# Patient Record
Sex: Male | Born: 1980 | Race: Black or African American | Hispanic: No | Marital: Single | Smoking: Never smoker
Health system: Southern US, Community
[De-identification: ages and names within clinical notes are randomized; demographics above are authoritative.]

## PROBLEM LIST (undated history)

## (undated) DIAGNOSIS — M1990 Unspecified osteoarthritis, unspecified site: Secondary | ICD-10-CM

## (undated) HISTORY — PX: HAND SURGERY: SHX662

---
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
## 2003-03-21 ENCOUNTER — Emergency Department (HOSPITAL_COMMUNITY): Admission: EM | Admit: 2003-03-21 | Discharge: 2003-03-21 | Payer: Self-pay | Admitting: Internal Medicine

## 2005-02-25 ENCOUNTER — Emergency Department (HOSPITAL_COMMUNITY): Admission: EM | Admit: 2005-02-25 | Discharge: 2005-02-25 | Payer: Self-pay | Admitting: Emergency Medicine

## 2005-04-19 ENCOUNTER — Emergency Department (HOSPITAL_COMMUNITY): Admission: EM | Admit: 2005-04-19 | Discharge: 2005-04-19 | Payer: Self-pay | Admitting: Emergency Medicine

## 2005-05-07 ENCOUNTER — Ambulatory Visit (HOSPITAL_COMMUNITY): Admission: RE | Admit: 2005-05-07 | Discharge: 2005-05-07 | Payer: Self-pay | Admitting: *Deleted

## 2008-02-03 ENCOUNTER — Emergency Department (HOSPITAL_COMMUNITY): Admission: EM | Admit: 2008-02-03 | Discharge: 2008-02-03 | Payer: Self-pay | Admitting: Emergency Medicine

## 2008-06-14 ENCOUNTER — Emergency Department (HOSPITAL_COMMUNITY): Admission: EM | Admit: 2008-06-14 | Discharge: 2008-06-14 | Payer: Self-pay | Admitting: Emergency Medicine

## 2008-07-11 ENCOUNTER — Emergency Department (HOSPITAL_COMMUNITY): Admission: EM | Admit: 2008-07-11 | Discharge: 2008-07-11 | Payer: Self-pay | Admitting: Emergency Medicine

## 2008-07-18 ENCOUNTER — Encounter: Payer: Self-pay | Admitting: Orthopedic Surgery

## 2008-07-19 ENCOUNTER — Ambulatory Visit: Payer: Self-pay | Admitting: Orthopedic Surgery

## 2008-07-19 DIAGNOSIS — S62339A Displaced fracture of neck of unspecified metacarpal bone, initial encounter for closed fracture: Secondary | ICD-10-CM | POA: Insufficient documentation

## 2008-07-24 ENCOUNTER — Encounter: Payer: Self-pay | Admitting: Orthopedic Surgery

## 2008-10-16 ENCOUNTER — Emergency Department (HOSPITAL_COMMUNITY): Admission: EM | Admit: 2008-10-16 | Discharge: 2008-10-16 | Payer: Self-pay | Admitting: Emergency Medicine

## 2010-08-04 LAB — POCT I-STAT, CHEM 8
BUN: 9 mg/dL (ref 6–23)
Calcium, Ion: 1.07 mmol/L — ABNORMAL LOW (ref 1.12–1.32)
Chloride: 105 mEq/L (ref 96–112)
Creatinine, Ser: 1.4 mg/dL (ref 0.4–1.5)
Glucose, Bld: 94 mg/dL (ref 70–99)
HCT: 42 % (ref 39.0–52.0)
Hemoglobin: 14.3 g/dL (ref 13.0–17.0)
Potassium: 3.8 mEq/L (ref 3.5–5.1)
Sodium: 139 mEq/L (ref 135–145)
TCO2: 23 mmol/L (ref 0–100)

## 2010-08-12 LAB — COMPREHENSIVE METABOLIC PANEL
ALT: 65 U/L — ABNORMAL HIGH (ref 0–53)
AST: 37 U/L (ref 0–37)
Albumin: 3.8 g/dL (ref 3.5–5.2)
Alkaline Phosphatase: 70 U/L (ref 39–117)
BUN: 14 mg/dL (ref 6–23)
CO2: 28 mEq/L (ref 19–32)
Calcium: 9.2 mg/dL (ref 8.4–10.5)
Chloride: 103 mEq/L (ref 96–112)
Creatinine, Ser: 1.06 mg/dL (ref 0.4–1.5)
GFR calc Af Amer: >60 mL/min (ref >60)
GFR calc non Af Amer: >60 mL/min (ref >60)
Glucose, Bld: 114 mg/dL — ABNORMAL HIGH (ref 70–99)
Potassium: 4 mEq/L (ref 3.5–5.1)
Sodium: 140 mEq/L (ref 135–145)
Total Bilirubin: 0.7 mg/dL (ref 0.3–1.2)
Total Protein: 8.1 g/dL (ref 6.0–8.3)

## 2010-08-12 LAB — DIFFERENTIAL
Basophils Absolute: 0 10*3/uL (ref 0.0–0.1)
Basophils Relative: 0 % (ref 0–1)
Eosinophils Absolute: 0.2 10*3/uL (ref 0.0–0.7)
Eosinophils Relative: 2 % (ref 0–5)
Lymphocytes Relative: 5 % — ABNORMAL LOW (ref 12–46)
Lymphs Abs: 0.6 10*3/uL — ABNORMAL LOW (ref 0.7–4.0)
Monocytes Absolute: 0.9 10*3/uL (ref 0.1–1.0)
Monocytes Relative: 7 % (ref 3–12)
Neutro Abs: 10.9 10*3/uL — ABNORMAL HIGH (ref 1.7–7.7)
Neutrophils Relative %: 86 % — ABNORMAL HIGH (ref 43–77)

## 2010-08-12 LAB — CBC
HCT: 43.1 % (ref 39.0–52.0)
Hemoglobin: 14 g/dL (ref 13.0–17.0)
MCHC: 32.4 g/dL (ref 30.0–36.0)
MCV: 75.7 fL — ABNORMAL LOW (ref 78.0–100.0)
Platelets: 274 10*3/uL (ref 150–400)
RBC: 5.69 MIL/uL (ref 4.22–5.81)
RDW: 16.2 % — ABNORMAL HIGH (ref 11.5–15.5)
WBC: 12.6 10*3/uL — ABNORMAL HIGH (ref 4.0–10.5)

## 2010-09-12 NOTE — Op Note (Signed)
NAME:  Adam Cowan, Adam Cowan                ACCOUNT NO.:  [id number]   MEDICAL RECORD NO.:  [id number]           PATIENT TYPE:   LOCATION:                                 FACILITY:   PHYSICIAN:  Tennis Must. Meyerdierks, M.D.DATE OF BIRTH:   DATE OF PROCEDURE:  05/07/2005  DATE OF DISCHARGE:                                 OPERATIVE REPORT   PREOP DIAGNOSIS:  Carpometacarpal dislocation, right fourth and fifth rays.   POSTOP DIAGNOSIS:  Carpometacarpal dislocation, right fourth and fifth rays.   PROCEDURE:  Closed reduction and percutaneous pinning carpometacarpal (CMC)  dislocation, right fourth and fifth ray.   SURGEON:  Lowell Bouton, M.D.   ANESTHESIA:  General.   OPERATIVE FINDINGS:  The patient had unstable dislocation dorsally of the  fourth and fifth carpometacarpal joints.   DESCRIPTION OF PROCEDURE:  Under general anesthesia the right hand was  prepped and draped in the usual fashion; and a closed reduction was  performed at the base of the fourth and fifth metacarpals. X-ray showed good  position of the joints. A 45 K-wire was passed longitudinally through the  fourth metacarpal into the carpus holding the joint reduced. A second 45 K-  wire was then placed across the fifth metacarpal into the hamate, holding  the joint reduced. The pins were bent over and left protruding from the  skin. X-rays showed good position of the joint. The patient was placed in a  volar wrist splint. He tolerated the procedure well, and went to the  recovery room awake and stable in good condition.      Lowell Bouton, M.D.  Electronically Signed     EMM/MEDQ  D:  05/07/2005  T:  05/07/2005  Job:  [id number]   cc:   Teola Bradley, M.D.  Fax: [phone]

## 2010-10-07 ENCOUNTER — Emergency Department (HOSPITAL_COMMUNITY)
Admission: EM | Admit: 2010-10-07 | Discharge: 2010-10-07 | Disposition: A | Discharge status: Home / Self Care | Payer: Self-pay | Attending: Emergency Medicine | Admitting: Emergency Medicine

## 2010-10-07 DIAGNOSIS — L02619 Cutaneous abscess of unspecified foot: Secondary | ICD-10-CM | POA: Insufficient documentation

## 2010-10-07 DIAGNOSIS — M7989 Other specified soft tissue disorders: Secondary | ICD-10-CM | POA: Insufficient documentation

## 2011-01-26 LAB — POCT I-STAT, CHEM 8
BUN: 10
Calcium, Ion: 1.13
Chloride: 106
Creatinine, Ser: 1.2
Glucose, Bld: 96
HCT: 43
Hemoglobin: 14.6
Potassium: 3.9
Sodium: 146 — ABNORMAL HIGH
TCO2: 26

## 2011-01-26 LAB — CBC
HCT: 39.3
Hemoglobin: 12.7 — ABNORMAL LOW
MCHC: 32.3
MCV: 77.5 — ABNORMAL LOW
Platelets: 264
RBC: 5.07
RDW: 16.4 — ABNORMAL HIGH
WBC: 8.6

## 2011-01-26 LAB — DIFFERENTIAL
Basophils Absolute: 0
Basophils Relative: 0
Eosinophils Absolute: 0.3
Eosinophils Relative: 3
Lymphocytes Relative: 35
Lymphs Abs: 3
Monocytes Absolute: 0.7
Monocytes Relative: 9
Neutro Abs: 4.5
Neutrophils Relative %: 53

## 2011-01-26 LAB — RAPID URINE DRUG SCREEN, HOSP PERFORMED
Amphetamines: NOT DETECTED
Barbiturates: NOT DETECTED
Benzodiazepines: NOT DETECTED
Cocaine: NOT DETECTED
Opiates: POSITIVE — AB
Tetrahydrocannabinol: NOT DETECTED

## 2011-01-26 LAB — POCT CARDIAC MARKERS
CKMB, poc: 1.6
Myoglobin, poc: 66
Troponin i, poc: <0.05

## 2011-01-26 LAB — D-DIMER, QUANTITATIVE: D-Dimer, Quant: 0.32

## 2011-11-24 ENCOUNTER — Emergency Department (HOSPITAL_COMMUNITY): Payer: Self-pay

## 2011-11-24 ENCOUNTER — Emergency Department (HOSPITAL_COMMUNITY)
Admission: EM | Admit: 2011-11-24 | Discharge: 2011-11-25 | Disposition: A | Discharge status: Home / Self Care | Payer: Self-pay | Attending: Emergency Medicine | Admitting: Emergency Medicine

## 2011-11-24 ENCOUNTER — Encounter (HOSPITAL_COMMUNITY): Payer: Self-pay | Admitting: Emergency Medicine

## 2011-11-24 DIAGNOSIS — S6010XA Contusion of unspecified finger with damage to nail, initial encounter: Secondary | ICD-10-CM

## 2011-11-24 DIAGNOSIS — Y9269 Other specified industrial and construction area as the place of occurrence of the external cause: Secondary | ICD-10-CM | POA: Insufficient documentation

## 2011-11-24 DIAGNOSIS — Y990 Civilian activity done for income or pay: Secondary | ICD-10-CM | POA: Insufficient documentation

## 2011-11-24 DIAGNOSIS — R229 Localized swelling, mass and lump, unspecified: Secondary | ICD-10-CM | POA: Insufficient documentation

## 2011-11-24 DIAGNOSIS — S6990XA Unspecified injury of unspecified wrist, hand and finger(s), initial encounter: Secondary | ICD-10-CM | POA: Insufficient documentation

## 2011-11-24 DIAGNOSIS — IMO0002 Reserved for concepts with insufficient information to code with codable children: Secondary | ICD-10-CM | POA: Insufficient documentation

## 2011-11-24 MED ORDER — HYDROCODONE-ACETAMINOPHEN 5-325 MG PO TABS: 1.0000 | ORAL_TABLET | Freq: Four times a day (QID) | ORAL | Status: AC | PRN

## 2011-11-24 NOTE — ED Provider Notes (Signed)
History     CSN: [id number]  Arrival date & time 11/24/11  2234   First MD Initiated Contact with Patient 11/24/11 2307      No chief complaint on file.   (Consider location/radiation/quality/duration/timing/severity/associated sxs/prior treatment) HPI Comments: R 4th finger crushed under manhole cover at work a few days ago.  The history is provided by the patient. No language interpreter was used.    History reviewed. No pertinent past medical history.  Past Surgical History  Procedure Date  . Medial collateral ligament and lateral collateral ligament repair, elbow 2001    left knee  . Hand surgery     fracture - pins     No family history on file.  History  Substance Use Topics  . Smoking status: Never Smoker   . Smokeless tobacco: Not on file  . Alcohol Use: No      Review of Systems  Musculoskeletal:       Finger injury.  Neurological: Negative for weakness and numbness.  All other systems reviewed and are negative.    Allergies  Review of patient's allergies indicates no known allergies.  Home Medications   Current Outpatient Rx  Name Route Sig Dispense Refill  . IBUPROFEN 200 MG PO TABS Oral Take 200-400 mg by mouth as needed.      BP 145/71  Pulse 76  Temp 97.4 F (36.3 C) (Oral)  Resp 18  Ht 6\' 5"  (1.956 m)  Wt 320 lb (145.151 kg)  BMI 37.95 kg/m2  SpO2 96%  Physical Exam  Nursing note and vitals reviewed. Constitutional: He is oriented to person, place, and time. He appears well-developed and well-nourished.  HENT:  Head: Normocephalic and atraumatic.  Eyes: EOM are normal.  Neck: Normal range of motion.  Cardiovascular: Normal rate, regular rhythm, normal heart sounds and intact distal pulses.   Pulmonary/Chest: Effort normal and breath sounds normal. No respiratory distress.  Abdominal: Soft. He exhibits no distension. There is no tenderness.  Musculoskeletal: He exhibits tenderness.       Right hand: He exhibits decreased  range of motion, tenderness and swelling. He exhibits normal capillary refill, no deformity and no laceration.       Hands: Neurological: He is alert and oriented to person, place, and time.  Skin: Skin is warm and dry.  Psychiatric: He has a normal mood and affect. Judgment normal.    ED Course  Procedures (including critical care time)  Labs Reviewed - No data to display No results found.   1. Subungual hematoma of finger       MDM  Ice, ibuprofen, splint for protection.  Return prn        Evalina Field, [place] 12/07/11 1553  Evalina Field, PA 12/07/11 1657

## 2011-11-24 NOTE — ED Notes (Addendum)
Finger caught by [employer] cover when replaced last Friday.   Painful / swollen.  Thought it would be ok.  States he informed his supervisor at work.  No treatment prior to coming to ED tonight

## 2011-11-25 MED ORDER — HYDROCODONE-ACETAMINOPHEN 5-325 MG PO TABS
1.0000 | ORAL_TABLET | Freq: Once | ORAL | Status: AC
  Administered 2011-11-25: 1 via ORAL
  Filled 2011-11-25: qty 1

## 2011-12-08 NOTE — ED Provider Notes (Signed)
Medical screening examination/treatment/procedure(s) were performed by non-physician practitioner and as supervising physician I was immediately available for consultation/collaboration.  Nicoletta Dress. Colon Branch, MD 12/08/11 [phone]

## 2013-08-28 ENCOUNTER — Encounter (HOSPITAL_COMMUNITY): Payer: Self-pay | Admitting: Emergency Medicine

## 2013-08-28 ENCOUNTER — Emergency Department (HOSPITAL_COMMUNITY): Payer: Self-pay

## 2013-08-28 ENCOUNTER — Emergency Department (HOSPITAL_COMMUNITY)
Admission: EM | Admit: 2013-08-28 | Discharge: 2013-08-28 | Disposition: A | Discharge status: Home / Self Care | Payer: Self-pay | Attending: Emergency Medicine | Admitting: Emergency Medicine

## 2013-08-28 DIAGNOSIS — R110 Nausea: Secondary | ICD-10-CM | POA: Insufficient documentation

## 2013-08-28 DIAGNOSIS — J209 Acute bronchitis, unspecified: Secondary | ICD-10-CM | POA: Insufficient documentation

## 2013-08-28 DIAGNOSIS — J40 Bronchitis, not specified as acute or chronic: Secondary | ICD-10-CM

## 2013-08-28 DIAGNOSIS — R51 Headache: Secondary | ICD-10-CM | POA: Insufficient documentation

## 2013-08-28 DIAGNOSIS — E669 Obesity, unspecified: Secondary | ICD-10-CM | POA: Insufficient documentation

## 2013-08-28 DIAGNOSIS — R197 Diarrhea, unspecified: Secondary | ICD-10-CM | POA: Insufficient documentation

## 2013-08-28 LAB — RAPID STREP SCREEN (MED CTR MEBANE ONLY): Streptococcus, Group A Screen (Direct): NEGATIVE

## 2013-08-28 MED ORDER — HYDROCOD POLST-CHLORPHEN POLST 10-8 MG/5ML PO LQCR
5.0000 mL | Freq: Once | ORAL | Status: AC
  Administered 2013-08-28: 5 mL via ORAL
  Filled 2013-08-28: qty 5

## 2013-08-28 MED ORDER — PHENYLEPH-PROMETHAZINE-COD 5-6.25-10 MG/5ML PO SYRP: 5.0000 mL | ORAL_SOLUTION | Freq: Two times a day (BID) | ORAL | Status: DC | PRN

## 2013-08-28 MED ORDER — AZITHROMYCIN 250 MG PO TABS: 250.0000 mg | ORAL_TABLET | Freq: Every day | ORAL | Status: DC

## 2013-08-28 NOTE — ED Notes (Signed)
Sore throat, nasal congestion, cough,

## 2013-08-28 NOTE — ED Provider Notes (Signed)
CSN: [id number]     Arrival date & time 08/28/13  1919 History   First MD Initiated Contact with Patient 08/28/13 1934     Chief Complaint  Patient presents with  . Sore Throat     (Consider location/radiation/quality/duration/timing/severity/associated sxs/prior Treatment) Patient is a 33 y.o. male presenting with pharyngitis. The history is provided by the patient.  Sore Throat This is a new problem. The current episode started in the past 7 days. The problem occurs constantly. The problem has been gradually worsening. Associated symptoms include chills, congestion, coughing, headaches, nausea, a sore throat and swollen glands. Pertinent negatives include no abdominal pain, anorexia, fever, rash or vomiting. The symptoms are aggravated by swallowing. He has tried NSAIDs for the symptoms. The treatment provided mild relief.   Adam Cowan is a 33 y.o. male who presents to the ED with sore throat and cough that started about a week ago. Does not know of any one sick that he was exposed to. Not sure about fever but has had chills.   History reviewed. No pertinent past medical history. Past Surgical History  Procedure Laterality Date  . Medial collateral ligament and lateral collateral ligament repair, elbow  2001    left knee  . Hand surgery      fracture - pins    History reviewed. No pertinent family history. History  Substance Use Topics  . Smoking status: Never Smoker   . Smokeless tobacco: Not on file  . Alcohol Use: No    Review of Systems  Constitutional: Positive for chills. Negative for fever.  HENT: Positive for congestion, rhinorrhea and sore throat. Negative for ear pain and trouble swallowing.   Eyes: Negative for visual disturbance.  Respiratory: Positive for cough.   Gastrointestinal: Positive for nausea and diarrhea. Negative for vomiting, abdominal pain, constipation and anorexia.  Genitourinary: Negative for dysuria, urgency and frequency.  Musculoskeletal:  Negative for back pain.  Skin: Negative for rash.  Neurological: Positive for headaches. Negative for seizures and syncope.  Psychiatric/Behavioral: Negative for confusion. The patient is not nervous/anxious.       Allergies  Review of patient's allergies indicates no known allergies.  Home Medications   Prior to Admission medications   Medication Sig Start Date End Date Taking? Authorizing Provider  ibuprofen (ADVIL,MOTRIN) 200 MG tablet Take 200-400 mg by mouth as needed.    Historical Provider, MD   BP 133/71  Pulse 80  Temp(Src) 97.8 F (36.6 C) (Oral)  Resp 20  Ht 6\' 5"  (1.956 m)  Wt 350 lb (158.759 kg)  BMI 41.50 kg/m2  SpO2 98% Physical Exam  Nursing note and vitals reviewed. Constitutional: He is oriented to person, place, and time. No distress.  Obese   HENT:  Head: Normocephalic.  Right Ear: Tympanic membrane normal.  Left Ear: Tympanic membrane normal.  Nose: Mucosal edema and rhinorrhea present.  Mouth/Throat: Mucous membranes are normal. Posterior oropharyngeal erythema present.  Eyes: Conjunctivae and EOM are normal. Pupils are equal, round, and reactive to light.  Neck: Normal range of motion. Neck supple.  Cardiovascular: Normal rate and regular rhythm.   Pulmonary/Chest: Effort normal. He has no wheezes. He has no rales.  Abdominal: Soft. Bowel sounds are normal. There is no tenderness.  Musculoskeletal: Normal range of motion.  Lymphadenopathy:    He has no cervical adenopathy.  Neurological: He is alert and oriented to person, place, and time. No cranial nerve deficit.  Skin: Skin is warm and dry.  Psychiatric: He has a  normal mood and affect. His behavior is normal.    ED Course  Procedures (including critical care time) Results for orders placed during the hospital encounter of 08/28/13 (from the past 24 hour(s))  RAPID STREP SCREEN     Status: None   Collection Time    08/28/13  7:42 PM      Result Value Ref Range   Streptococcus, Group A  Screen (Direct) NEGATIVE  NEGATIVE    Dg Chest 2 View  08/28/2013   CLINICAL DATA:  Sore throat, congestion  EXAM: CHEST  2 VIEW  COMPARISON:  DG CHEST 2 VIEW dated 02/03/2008  FINDINGS: The heart size and mediastinal contours are within normal limits. Both lungs are clear. The visualized skeletal structures are unremarkable.  IMPRESSION: No active cardiopulmonary disease.   Electronically Signed   By: Elige KoHetal  Patel   On: 08/28/2013 21:20    MDM  33 y.o. male with sore throat, productive cough, congestion x one week. Symptoms seem to be getting worse over the past 2 days. Patient stable for discharge without pneumonia. I have reviewed this patient's vital signs, nurses notes, appropriate labs and imaging.  I have discussed findings and plan of care with the patient and he voices understanding and agrees with plan.    Medication List    TAKE these medications       azithromycin 250 MG tablet  Commonly known as:  ZITHROMAX  Take 1 tablet (250 mg total) by mouth daily. Take first 2 tablets together, then 1 every day until finished.     Phenyleph-Promethazine-Cod 5-6.25-10 MG/5ML Syrp  Take 5 mLs by mouth 2 (two) times daily as needed.      ASK your doctor about these medications       ibuprofen 200 MG tablet  Commonly known as:  ADVIL,MOTRIN  Take 200-400 mg by mouth daily as needed.           Janne NapoleonHope M Neese, [place]NP 08/29/13 [phone]

## 2013-08-28 NOTE — Discharge Instructions (Signed)
Your chest x-ray shows no pneumonia, your strep screen is negative. I am treating you for bronchitis. Take the medication as directed. Do not take the cough medication if you are driving as it will make you sleepy. Return for worsening symptoms

## 2013-08-29 NOTE — ED Provider Notes (Signed)
Medical screening examination/treatment/procedure(s) were performed by non-physician practitioner and as supervising physician I was immediately available for consultation/collaboration.   EKG Interpretation None        Maniah Nading L Kahley Leib, MD 08/29/13 2233 

## 2013-08-30 LAB — CULTURE, GROUP A STREP

## 2013-12-22 ENCOUNTER — Encounter (HOSPITAL_COMMUNITY): Payer: Self-pay | Admitting: Emergency Medicine

## 2013-12-22 ENCOUNTER — Emergency Department (HOSPITAL_COMMUNITY)
Admission: EM | Admit: 2013-12-22 | Discharge: 2013-12-22 | Disposition: A | Discharge status: Home / Self Care | Payer: Self-pay | Attending: Emergency Medicine | Admitting: Emergency Medicine

## 2013-12-22 DIAGNOSIS — H9209 Otalgia, unspecified ear: Secondary | ICD-10-CM | POA: Insufficient documentation

## 2013-12-22 DIAGNOSIS — J029 Acute pharyngitis, unspecified: Secondary | ICD-10-CM | POA: Insufficient documentation

## 2013-12-22 DIAGNOSIS — H6121 Impacted cerumen, right ear: Secondary | ICD-10-CM

## 2013-12-22 DIAGNOSIS — H6120 Impacted cerumen, unspecified ear: Secondary | ICD-10-CM | POA: Insufficient documentation

## 2013-12-22 MED ORDER — ANTIPYRINE-BENZOCAINE 5.4-1.4 % OT SOLN
3.0000 [drp] | Freq: Once | OTIC | Status: AC
  Administered 2013-12-22: 3 [drp] via OTIC
  Filled 2013-12-22: qty 10

## 2013-12-22 NOTE — Discharge Instructions (Signed)
Cerumen Impaction °A cerumen impaction is when the wax in your ear forms a plug. This plug usually causes reduced hearing. Sometimes it also causes an earache or dizziness. Removing a cerumen impaction can be difficult and painful. The wax sticks to the ear canal. The canal is sensitive and bleeds easily. If you try to remove a heavy wax buildup with a cotton tipped swab, you may push it in further. °Irrigation with water, suction, and small ear curettes may be used to clear out the wax. If the impaction is fixed to the skin in the ear canal, ear drops may be needed for a few days to loosen the wax. People who build up a lot of wax frequently can use ear wax removal products available in your local drugstore. °SEEK MEDICAL CARE IF:  °You develop an earache, increased hearing loss, or marked dizziness. °Document Released: 05/21/2004 Document Revised: 07/06/2011 Document Reviewed: 07/11/2009 °ExitCare® Patient Information ©2015 [employer]. This information is not intended to replace advice given to you by your health care provider. Make sure you discuss any questions you have with your health care provider. ° °

## 2013-12-22 NOTE — ED Notes (Signed)
Rt ear pain for 2 weeks, sl sore throat. No  Fever.

## 2013-12-24 NOTE — ED Provider Notes (Signed)
CSN: [id number]     Arrival date & time 12/22/13  2008 History   First MD Initiated Contact with Patient 12/22/13 2118     Chief Complaint  Patient presents with  . Otalgia     (Consider location/radiation/quality/duration/timing/severity/associated sxs/prior Treatment) HPI Adam Cowan is a 33 y.o. male who presents to the Emergency Department complaining of right ear pain and diminished hearing for 2 weeks.  He also reports sore throat for several days.  He denies fever, cough, nasal congestion or neck pain.  He has not tried any medications prior to ED arrival.     History reviewed. No pertinent past medical history. Past Surgical History  Procedure Laterality Date  . Medial collateral ligament and lateral collateral ligament repair, elbow  2001    left knee  . Hand surgery      fracture - pins    History reviewed. No pertinent family history. History  Substance Use Topics  . Smoking status: Never Smoker   . Smokeless tobacco: Not on file  . Alcohol Use: No    Review of Systems  Constitutional: Negative for fever, chills, activity change and appetite change.  HENT: Positive for ear pain, hearing loss and sore throat. Negative for congestion, facial swelling, rhinorrhea and trouble swallowing.   Eyes: Negative for visual disturbance.  Respiratory: Negative for cough, shortness of breath, wheezing and stridor.   Gastrointestinal: Negative for nausea and vomiting.  Musculoskeletal: Negative for neck pain and neck stiffness.  Skin: Negative.   Neurological: Negative for dizziness, weakness, numbness and headaches.  Hematological: Negative for adenopathy.  Psychiatric/Behavioral: Negative for confusion.  All other systems reviewed and are negative.     Allergies  Review of patient's allergies indicates no known allergies.  Home Medications   Prior to Admission medications   Not on File   BP 144/76  Pulse 71  Temp(Src) 98.5 F (36.9 C) (Oral)  Resp 18  Ht 6'  5" (1.956 m)  Wt 335 lb (151.955 kg)  BMI 39.72 kg/m2  SpO2 99% Physical Exam  Nursing note and vitals reviewed. Constitutional: He is oriented to person, place, and time. He appears well-developed and well-nourished. No distress.  HENT:  Head: Normocephalic and atraumatic.  Mouth/Throat: Uvula is midline, oropharynx is clear and moist and mucous membranes are normal. No uvula swelling. No oropharyngeal exudate.  Cerumen impaction to right ear.  Unable to visual the TM.  No drainage or edema of the ear canal,   Neck: Normal range of motion. Neck supple.  Cardiovascular: Normal rate, regular rhythm, normal heart sounds and intact distal pulses.   No murmur heard. Pulmonary/Chest: Effort normal and breath sounds normal. No stridor. No respiratory distress.  Musculoskeletal: Normal range of motion.  Lymphadenopathy:    He has no cervical adenopathy.  Neurological: He is alert and oriented to person, place, and time. Coordination normal.  Skin: Skin is warm and dry. No rash noted.    ED Course  Procedures (including critical care time) Labs Review Labs Reviewed - No data to display  Imaging Review No results found.   EKG Interpretation None      MDM   Final diagnoses:  Cerumen impaction, right    Pt is well appearing, NAD.  Ambulates with steady gait.    Pt has a large cerumen impaction to the right ear.  Ear was flushed by nursing staff with improvement of the pain, but only small amts of cerumen removed.  I removed a moderate amt of cerumen  using a curette, TM only partially visualized, but appears nml.  Pt reports pain has improved and hearing improved.  I have dispensed auralgan otic drops and advised pt to continue use of the drops and try OTC ear flush kit.  Pt agrees to appears stable for d/c    Kahla Risdon L. Vanessa [place], PA-C 12/24/13 1916

## 2013-12-24 NOTE — ED Provider Notes (Signed)
Medical screening examination/treatment/procedure(s) were performed by non-physician practitioner and as supervising physician I was immediately available for consultation/collaboration.   EKG Interpretation None        Sharonna Vinje L Dorina Ribaudo, MD 12/24/13 2220 

## 2016-06-03 ENCOUNTER — Emergency Department (HOSPITAL_COMMUNITY)
Admission: EM | Admit: 2016-06-03 | Discharge: 2016-06-03 | Disposition: A | Discharge status: Home / Self Care | Payer: 59 | Attending: Emergency Medicine | Admitting: Emergency Medicine

## 2016-06-03 ENCOUNTER — Encounter (HOSPITAL_COMMUNITY): Payer: Self-pay | Admitting: Emergency Medicine

## 2016-06-03 DIAGNOSIS — R0981 Nasal congestion: Secondary | ICD-10-CM | POA: Insufficient documentation

## 2016-06-03 DIAGNOSIS — J111 Influenza due to unidentified influenza virus with other respiratory manifestations: Secondary | ICD-10-CM

## 2016-06-03 DIAGNOSIS — R509 Fever, unspecified: Secondary | ICD-10-CM | POA: Insufficient documentation

## 2016-06-03 DIAGNOSIS — R05 Cough: Secondary | ICD-10-CM | POA: Insufficient documentation

## 2016-06-03 DIAGNOSIS — M791 Myalgia: Secondary | ICD-10-CM | POA: Insufficient documentation

## 2016-06-03 DIAGNOSIS — R69 Illness, unspecified: Secondary | ICD-10-CM

## 2016-06-03 MED ORDER — OSELTAMIVIR PHOSPHATE 75 MG PO CAPS: 75.0000 mg | ORAL_CAPSULE | Freq: Two times a day (BID) | ORAL | 0 refills | Status: DC

## 2016-06-03 NOTE — ED Provider Notes (Signed)
[facility]-EMERGENCY DEPT Provider Note   CSN: [id number] Arrival date & time: 06/03/16  1340     History   Chief Complaint Chief Complaint  Patient presents with  . Generalized Body Aches    HPI Adam Cowan is a 36 y.o. male.  HPI  Pt was seen at 1420.  Per pt, c/o gradual onset and persistence of constant runny/stuffy nose, sinus congestion, and cough since yesterday. Has been associated with subjective home fevers/chills and generalized body aches. +family member recently dx with flu. Denies rash, no CP/SOB, no N/V/D, no abd pain.    History reviewed. No pertinent past medical history.  Patient Active Problem List   Diagnosis Date Noted  . CLOSED FRACTURE OF NECK OF METACARPAL BONE 07/19/2008    Past Surgical History:  Procedure Laterality Date  . HAND SURGERY     fracture - pins   . MEDIAL COLLATERAL LIGAMENT AND LATERAL COLLATERAL LIGAMENT REPAIR, ELBOW  2001   left knee       Home Medications    Prior to Admission medications   Not on File    Family History History reviewed. No pertinent family history.  Social History Social History  Substance Use Topics  . Smoking status: Never Smoker  . Smokeless tobacco: Never Used  . Alcohol use No     Allergies   Patient has no known allergies.   Review of Systems Review of Systems ROS: Statement: All systems negative except as marked or noted in the HPI; Constitutional: +subjective fever and chills, generalized body aches.. ; ; Eyes: Negative for eye pain, redness and discharge. ; ; ENMT: Negative for ear pain, hoarseness, sore throat. +nasal congestion, sinus pressure and rhinorrhea. ; ; Cardiovascular: Negative for chest pain, palpitations, diaphoresis, dyspnea and peripheral edema. ; ; Respiratory: +cough. Negative for wheezing and stridor. ; ; Gastrointestinal: Negative for nausea, vomiting, diarrhea, abdominal pain, blood in stool, hematemesis, jaundice and rectal bleeding. . ; ; Genitourinary: Negative  for dysuria, flank pain and hematuria. ; ; Musculoskeletal: Negative for back pain and neck pain. Negative for swelling and trauma.; ; Skin: Negative for pruritus, rash, abrasions, blisters, bruising and skin lesion.; ; Neuro: Negative for headache, lightheadedness and neck stiffness. Negative for weakness, altered level of consciousness, altered mental status, extremity weakness, paresthesias, involuntary movement, seizure and syncope.      Physical Exam Updated Vital Signs BP 138/77 (BP Location: Left Arm)   Pulse 81   Temp 98.3 F (36.8 C) (Oral)   Resp 18   Ht 6\' 5"  (1.956 m)   Wt (!) 330 lb (149.7 kg)   SpO2 99%   BMI 39.13 kg/m   Physical Exam 1425: Physical examination:  Nursing notes reviewed; Vital signs and O2 SAT reviewed;  Constitutional: Well developed, Well nourished, Well hydrated, In no acute distress; Head:  Normocephalic, atraumatic; Eyes: EOMI, PERRL, No scleral icterus; ENMT: TM's clear bilat. +edemetous nasal turbinates bilat with clear rhinorrhea. Mouth and pharynx without lesions. No tonsillar exudates. No intra-oral edema. No submandibular or sublingual edema. No hoarse voice, no drooling, no stridor. No pain with manipulation of larynx. No trismus. Mouth and pharynx normal, Mucous membranes moist; Neck: Supple, Full range of motion, No lymphadenopathy; Cardiovascular: Regular rate and rhythm, No gallop; Respiratory: Breath sounds clear & equal bilaterally, No wheezes.  Speaking full sentences with ease, Normal respiratory effort/excursion; Chest: Nontender, Movement normal; Abdomen: Soft, Nontender, Nondistended, Normal bowel sounds; Genitourinary: No CVA tenderness; Extremities: Pulses normal, No tenderness, No edema, No calf edema  or asymmetry.; Neuro: AA&Ox3, Major CN grossly intact.  Speech clear. No gross focal motor or sensory deficits in extremities.; Skin: Color normal, Warm, Dry.   ED Treatments / Results  Labs (all labs ordered are listed, but only abnormal  results are displayed)   EKG  EKG Interpretation None       Radiology   Procedures Procedures (including critical care time)  Medications Ordered in ED Medications - No data to display   Initial Impression / Assessment and Plan / ED Course  I have reviewed the triage vital signs and the nursing notes.  Pertinent labs & imaging results that were available during my care of the patient were reviewed by me and considered in my medical decision making (see chart for details).  MDM Reviewed: previous chart, nursing note and vitals    1430:  Tx symptomatically. Dx d/w pt.  Questions answered.  Verb understanding, agreeable to d/c home with outpt f/u.    Final Clinical Impressions(s) / ED Diagnoses   Final diagnoses:  None    New Prescriptions New Prescriptions   No medications on file     Samuel Jester, DO 06/05/16 [phone]

## 2016-06-03 NOTE — ED Triage Notes (Signed)
PT states body aches, dry cough, fever of 99.6 x1 day. PT states daughter with same symptoms that was dx with flu this past Monday.

## 2016-06-03 NOTE — Discharge Instructions (Signed)
Take over the counter tylenol and ibuprofen, as directed on packaging, as needed for discomfort.  Take over the counter decongestant (such as sudafed), as directed on packaging, for the next week.  Use over the counter normal saline nasal spray, as instructed in the Emergency Department, several times per day for the next 2 weeks.  Call your regular medical doctor tomorrow to schedule a follow up appointment this week.  Return to the Emergency Department immediately if worsening.

## 2016-06-10 ENCOUNTER — Encounter (HOSPITAL_COMMUNITY): Payer: Self-pay | Admitting: Emergency Medicine

## 2016-06-10 ENCOUNTER — Emergency Department (HOSPITAL_COMMUNITY): Payer: 59

## 2016-06-10 ENCOUNTER — Emergency Department (HOSPITAL_COMMUNITY)
Admission: EM | Admit: 2016-06-10 | Discharge: 2016-06-10 | Disposition: A | Discharge status: Home / Self Care | Payer: 59 | Attending: Emergency Medicine | Admitting: Emergency Medicine

## 2016-06-10 DIAGNOSIS — J029 Acute pharyngitis, unspecified: Secondary | ICD-10-CM | POA: Insufficient documentation

## 2016-06-10 DIAGNOSIS — R5383 Other fatigue: Secondary | ICD-10-CM | POA: Insufficient documentation

## 2016-06-10 DIAGNOSIS — R05 Cough: Secondary | ICD-10-CM | POA: Diagnosis present

## 2016-06-10 DIAGNOSIS — J111 Influenza due to unidentified influenza virus with other respiratory manifestations: Secondary | ICD-10-CM

## 2016-06-10 DIAGNOSIS — R69 Illness, unspecified: Secondary | ICD-10-CM

## 2016-06-10 DIAGNOSIS — R059 Cough, unspecified: Secondary | ICD-10-CM

## 2016-06-10 MED ORDER — BENZONATATE 100 MG PO CAPS
200.0000 mg | ORAL_CAPSULE | Freq: Once | ORAL | Status: AC
  Administered 2016-06-10: 200 mg via ORAL
  Filled 2016-06-10: qty 2

## 2016-06-10 MED ORDER — BENZONATATE 100 MG PO CAPS: 200.0000 mg | ORAL_CAPSULE | Freq: Three times a day (TID) | ORAL | 0 refills | Status: DC | PRN

## 2016-06-10 MED ORDER — GUAIFENESIN-CODEINE 100-10 MG/5ML PO SOLN: 10.0000 mL | Freq: Three times a day (TID) | ORAL | 0 refills | Status: DC | PRN

## 2016-06-10 NOTE — Discharge Instructions (Signed)
You may take the medicines prescribed for cough relief.  The robitussin AC contains a narcotic medicine and will make you sleepy.  You should not drive within 4 hours of taking this medicine.  Rest and make sure you are drinking plenty of fluids.  Cough drops may also help slow down the frequency of your coughing.  Your chest xray is negative for any infections requiring antibiotics (no pneumonia).

## 2016-06-10 NOTE — ED Triage Notes (Addendum)
Pt c/o cough, sore throat since Monday. Pt dx with flu on 06/03/16 and completed po tamiflu last night.

## 2016-06-10 NOTE — ED Provider Notes (Signed)
[facility]-EMERGENCY DEPT Provider Note   CSN: [id number] Arrival date & time: 06/10/16  1531     History   Chief Complaint Chief Complaint  Patient presents with  . Cough    HPI Adam ICKES is a 36 y.o. 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  He was at work today when he had a coughing spell causing him to leave for the day.  He endorses coughing clear to white sputum. He also reports mild sore throat which started after the coughing. He denies persistent fevers, also denies sob, chest pain, n/v/d or abdominal pain.  HPI  History reviewed. No pertinent past medical history.  Patient Active Problem List   Diagnosis Date Noted  . CLOSED FRACTURE OF NECK OF METACARPAL BONE 07/19/2008    Past Surgical History:  Procedure Laterality Date  . HAND SURGERY     fracture - pins   . MEDIAL COLLATERAL LIGAMENT AND LATERAL COLLATERAL LIGAMENT REPAIR, ELBOW  2001   left knee       Home Medications    Prior to Admission medications   Medication Sig Start Date End Date Taking? Authorizing Provider  benzonatate (TESSALON) 100 MG capsule Take 2 capsules (200 mg total) by mouth 3 (three) times daily as needed. 06/10/16   Burgess Amor, PA-C  guaiFENesin-codeine 100-10 MG/5ML syrup Take 10 mLs by mouth 3 (three) times daily as needed for cough. 06/10/16   Burgess Amor, PA-C  oseltamivir (TAMIFLU) 75 MG capsule Take 1 capsule (75 mg total) by mouth every 12 (twelve) hours. 06/03/16   Samuel Jester, DO    Family History No family history on file.  Social History Social History  Substance Use Topics  . Smoking status: Never Smoker  . Smokeless tobacco: Never Used  . Alcohol use No     Allergies   Patient has no known allergies.   Review of Systems Review of Systems    Constitutional: Negative for chills and fever.  HENT: Positive for sore throat. Negative for congestion, ear pain, rhinorrhea, sinus pressure, trouble swallowing and voice change.   Eyes: Negative for discharge.  Respiratory: Positive for cough. Negative for shortness of breath, wheezing and stridor.   Cardiovascular: Negative for chest pain.  Gastrointestinal: Negative for abdominal pain.  Genitourinary: Negative.      Physical Exam Updated Vital Signs BP 133/77 (BP Location: Left Arm)   Pulse 81   Temp 98.6 F (37 C) (Oral)   Resp 16   Ht 6\' 5"  (1.956 m)   Wt (!) 149.7 kg   SpO2 100%   BMI 39.13 kg/m   Physical Exam  Constitutional: He is oriented to person, place, and time. He appears well-developed and well-nourished.  HENT:  Head: Normocephalic and atraumatic.  Right Ear: Tympanic membrane and ear canal normal.  Left Ear: Tympanic membrane and ear canal normal.  Nose: No mucosal edema or rhinorrhea.  Mouth/Throat: Uvula is midline, oropharynx is clear and moist and mucous membranes are normal. No oropharyngeal exudate, posterior oropharyngeal edema, posterior oropharyngeal erythema or tonsillar abscesses.  Eyes: Conjunctivae are normal.  Cardiovascular: Normal rate and normal heart sounds.   Pulmonary/Chest: Effort normal. No respiratory distress. He has no decreased breath sounds. He has no wheezes. He has rhonchi in the right lower field. He has  no rales.  Rhonchi RLF clearing with cough.  Abdominal: Soft. There is no tenderness.  Musculoskeletal: Normal range of motion.  Neurological: He is alert and oriented to person, place, and time.  Skin: Skin is warm and dry. No rash noted.  Psychiatric: He has a normal mood and affect.     ED Treatments / Results  Labs (all labs ordered are listed, but only abnormal results are displayed) Labs Reviewed - No data to display  EKG  EKG Interpretation Cowan       Radiology Dg Chest 2 View  Result Date:  06/10/2016 CLINICAL DATA:  Productive cough and dyspnea x3 days EXAM: CHEST  2 VIEW COMPARISON:  08/28/2013 FINDINGS: The heart size and mediastinal contours are within normal limits. Both lungs are clear. The visualized skeletal structures are unremarkable. IMPRESSION: No active cardiopulmonary disease. Electronically Signed   By: Tollie Ethavid  Kwon M.D.   On: 06/10/2016 17:44    Procedures Procedures (including critical care time)  Medications Ordered in ED Medications  benzonatate (TESSALON) capsule 200 mg (200 mg Oral Given 06/10/16 1732)     Initial Impression / Assessment and Plan / ED Course  I have reviewed the triage vital signs and the nursing notes.  Pertinent labs & imaging results that were available during my care of the patient were reviewed by me and considered in my medical decision making (see chart for details).     Pt with recent influenza like illness with persistent cough but no findings on cxr.  He was given tessalon here, also added robitussin AC prn persistent cough. Encouraged fluids, cough lozenges. Prn f/u if sx persist or do not improve.  VSS, pt in no respiratory distress.    The patient appears reasonably screened and/or stabilized for discharge and I doubt any other medical condition or other [facility] requiring further screening, evaluation, or treatment in the ED at this time prior to discharge.   Final Clinical Impressions(s) / ED Diagnoses   Final diagnoses:  Influenza-like illness  Cough    New Prescriptions New Prescriptions   BENZONATATE (TESSALON) 100 MG CAPSULE    Take 2 capsules (200 mg total) by mouth 3 (three) times daily as needed.   GUAIFENESIN-CODEINE 100-10 MG/5ML SYRUP    Take 10 mLs by mouth 3 (three) times daily as needed for cough.     Burgess AmorJulie Antonae Zbikowski, PA-C 06/10/16 1858    Linwood DibblesJon Knapp, MD 06/11/16 [phone]

## 2018-04-30 ENCOUNTER — Encounter (HOSPITAL_COMMUNITY): Payer: Self-pay | Admitting: Emergency Medicine

## 2018-04-30 ENCOUNTER — Emergency Department (HOSPITAL_COMMUNITY)
Admission: EM | Admit: 2018-04-30 | Discharge: 2018-04-30 | Disposition: A | Discharge status: Home / Self Care | Payer: BLUE CROSS/BLUE SHIELD | Attending: Emergency Medicine | Admitting: Emergency Medicine

## 2018-04-30 DIAGNOSIS — M25561 Pain in right knee: Secondary | ICD-10-CM | POA: Diagnosis not present

## 2018-04-30 DIAGNOSIS — M25562 Pain in left knee: Secondary | ICD-10-CM | POA: Diagnosis not present

## 2018-04-30 MED ORDER — HYDROCODONE-ACETAMINOPHEN 7.5-325 MG PO TABS: 1.0000 | ORAL_TABLET | Freq: Four times a day (QID) | ORAL | 0 refills | Status: DC | PRN

## 2018-04-30 MED ORDER — IBUPROFEN 800 MG PO TABS
800.0000 mg | ORAL_TABLET | Freq: Once | ORAL | Status: AC
  Administered 2018-04-30: 800 mg via ORAL
  Filled 2018-04-30: qty 1

## 2018-04-30 MED ORDER — DICLOFENAC SODIUM 75 MG PO TBEC: 75.0000 mg | DELAYED_RELEASE_TABLET | Freq: Two times a day (BID) | ORAL | 0 refills | Status: DC

## 2018-04-30 NOTE — Discharge Instructions (Addendum)
Elevate and apply ice packs on and off to your knees.  Wear the brace on your right knee as needed for support but do not wear it continuously or at bedtime.  Take the medication as directed and call Dr. Mort Sawyers office to arrange a follow-up appointment in 1 week if your symptoms are not improving.

## 2018-04-30 NOTE — ED Triage Notes (Signed)
Pt states both his knees have been hurting and swollen since yesterday.  No new injury.  Has been working 12 hours standing.

## 2018-04-30 NOTE — ED Provider Notes (Signed)
[facility] Provider Note   CSN: [id number] Arrival date & time: 04/30/18  1719     History   Chief Complaint Chief Complaint  Patient presents with  . Knee Pain    HPI Adam Cowan is a 38 y.o. male.  HPI   Adam Cowan is a 38 y.o. male who presents to the Emergency Department complaining of bilateral knee pain with right greater than left.  He states the pain is a result of his job which requires him to walk and stand for 12 hours a day.  Symptoms have been hurting for some time, but worse pain and swelling since yesterday.  He has tried over-the-counter pain relievers without relief.  He notes swelling of the right knee.  He denies fall or excessive squatting.  He states that at times, his right knee feels as though it is going to "give way".  He denies hip or ankle pain, redness, numbness of the extremity or previous injury of the right knee.  He does admit to previous left knee injury and orthoscopic surgery.   History reviewed. No pertinent past medical history.  Patient Active Problem List   Diagnosis Date Noted  . CLOSED FRACTURE OF NECK OF METACARPAL BONE 07/19/2008    Past Surgical History:  Procedure Laterality Date  . HAND SURGERY     fracture - pins   . MEDIAL COLLATERAL LIGAMENT AND LATERAL COLLATERAL LIGAMENT REPAIR, ELBOW  2001   left knee      Home Medications    Prior to Admission medications   Medication Sig Start Date End Date Taking? Authorizing Provider  benzonatate (TESSALON) 100 MG capsule Take 2 capsules (200 mg total) by mouth 3 (three) times daily as needed. 06/10/16   Burgess AmorIdol, Julie, PA-C  diclofenac (VOLTAREN) 75 MG EC tablet Take 1 tablet (75 mg total) by mouth 2 (two) times daily. Take with food 04/30/18   Michalene Debruler, PA-C  guaiFENesin-codeine 100-10 MG/5ML syrup Take 10 mLs by mouth 3 (three) times daily as needed for cough. 06/10/16   Burgess AmorIdol, Julie, PA-C  HYDROcodone-acetaminophen (NORCO) 7.5-325 MG tablet Take 1  tablet by mouth every 6 (six) hours as needed for moderate pain. 04/30/18   Okley Magnussen, PA-C  oseltamivir (TAMIFLU) 75 MG capsule Take 1 capsule (75 mg total) by mouth every 12 (twelve) hours. 06/03/16   Samuel JesterMcManus, Kathleen, DO    Family History History reviewed. No pertinent family history.  Social History Social History   Tobacco Use  . Smoking status: Never Smoker  . Smokeless tobacco: Never Used  Substance Use Topics  . Alcohol use: No  . Drug use: No     Allergies   Patient has no known allergies.   Review of Systems Review of Systems  Constitutional: Negative for chills and fever.  Respiratory: Negative for shortness of breath.   Cardiovascular: Negative for chest pain.  Musculoskeletal: Positive for arthralgias (Bilateral knee pain) and joint swelling.  Skin: Negative for color change and wound.  Neurological: Negative for weakness and numbness.     Physical Exam Updated Vital Signs BP (!) 153/93 (BP Location: Right Arm)   Pulse (!) 58   Temp 97.6 F (36.4 C) (Oral)   Resp 18   Ht 6\' 5"  (1.956 m)   Wt (!) 140.6 kg   SpO2 100%   BMI 36.76 kg/m   Physical Exam Vitals signs and nursing note reviewed.  Constitutional:      Appearance: Normal appearance.  HENT:  Head: Atraumatic.  Neck:     Musculoskeletal: Normal range of motion.  Cardiovascular:     Rate and Rhythm: Normal rate and regular rhythm.     Pulses: Normal pulses.  Pulmonary:     Effort: Pulmonary effort is normal.     Breath sounds: Normal breath sounds.  Musculoskeletal: Normal range of motion.        General: Swelling and tenderness present. No deformity.     Right knee: He exhibits swelling. He exhibits normal range of motion. No patellar tendon tenderness noted.       Legs:     Comments: Tenderness to palpation of the lower bilateral knees.  Pain throughout range of motion of both joints with right greater than left.  Moderate edema noted of the right knee.  There is pain to the  right knee with valgus and varus stress.  Negative drawer sign.  No excessive warmth or erythema.  Skin:    General: Skin is warm.     Capillary Refill: Capillary refill takes less than 2 seconds.     Findings: No erythema or rash.  Neurological:     General: No focal deficit present.     Mental Status: He is alert.     Sensory: No sensory deficit.     Motor: No weakness.      ED Treatments / Results  Labs (all labs ordered are listed, but only abnormal results are displayed) Labs Reviewed - No data to display  EKG None  Radiology No results found.    Procedures Procedures (including critical care time)  Medications Ordered in ED Medications  ibuprofen (ADVIL,MOTRIN) tablet 800 mg (800 mg Oral Given 04/30/18 1836)     Initial Impression / Assessment and Plan / ED Course  I have reviewed the triage vital signs and the nursing notes.  Pertinent labs & imaging results that were available during my care of the patient were reviewed by me and considered in my medical decision making (see chart for details).     Patient with bilateral knee pain for 1 day.  This has been a reoccurring problem.  No history of trauma to indicate need for imaging at this time.  Neurovascularly intact.  Symptoms felt to be related to his job.  No concerning symptoms for septic joint. He is ambulatory with a steady gait.    Knee sleeve applied to right knee.  He agrees to symptomatic tx and close orthopedic f/u if not improving.    Final Clinical Impressions(s) / ED Diagnoses   Final diagnoses:  Acute pain of both knees    ED Discharge Orders         Ordered    diclofenac (VOLTAREN) 75 MG EC tablet  2 times daily     04/30/18 1837    HYDROcodone-acetaminophen (NORCO) 7.5-325 MG tablet  Every 6 hours PRN     04/30/18 1837           [name], [name], PA-C 05/01/18 0038    [name], [name], MD 05/01/18 1734

## 2018-05-25 ENCOUNTER — Ambulatory Visit (INDEPENDENT_AMBULATORY_CARE_PROVIDER_SITE_OTHER): Payer: BLUE CROSS/BLUE SHIELD

## 2018-05-25 ENCOUNTER — Ambulatory Visit (INDEPENDENT_AMBULATORY_CARE_PROVIDER_SITE_OTHER): Payer: BLUE CROSS/BLUE SHIELD | Admitting: Orthopaedic Surgery

## 2018-05-25 ENCOUNTER — Encounter: Payer: Self-pay | Admitting: Orthopaedic Surgery

## 2018-05-25 VITALS — BP 126/76 | HR 57 | Ht 77.0 in | Wt 311.0 lb

## 2018-05-25 DIAGNOSIS — M25561 Pain in right knee: Secondary | ICD-10-CM | POA: Diagnosis not present

## 2018-05-25 DIAGNOSIS — G8929 Other chronic pain: Secondary | ICD-10-CM

## 2018-05-25 NOTE — Progress Notes (Signed)
Subjective:    Patient ID: Adam Cowan, male    DOB: 1981/03/17, 38 y.o.   MRN: [id number]  HPI He has had knee pain on the right since summer getting progressively worse.  He has had swelling and popping.  Right after the New Year his knee got much worse.  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  He has no trauma, no redness, no weakness.  He has taken ibuprofen 800 tid and used heat and ice and elevation with little help.  He went to the ER on 05-01-2018.  No x-rays were done.  I have reviewed the notes.  He is very concerned about his knee and it not getting any better.   Review of Systems  Constitutional: Positive for activity change.  Musculoskeletal: Positive for arthralgias, gait problem and joint swelling.  All other systems reviewed and are negative.  For Review of Systems, all other systems reviewed and are negative.  The following is a summary of the past history medically, past history surgically, known current medicines, social history and family history.  This information is gathered electronically by the computer from prior information and documentation.  I review this each visit and have found including this information at this point in the chart is beneficial and informative.   History reviewed. No pertinent past medical history.  Past Surgical History:  Procedure Laterality Date  . HAND SURGERY     fracture - pins   . MEDIAL COLLATERAL LIGAMENT AND LATERAL COLLATERAL LIGAMENT REPAIR, ELBOW  2001   left knee    Current Outpatient Medications on File Prior to Visit  Medication Sig Dispense Refill  . diclofenac (VOLTAREN) 75 MG EC tablet Take 1 tablet (75 mg total) by mouth 2 (two) times daily. Take with food (Patient not taking: Reported on 05/25/2018) 14 tablet 0  . HYDROcodone-acetaminophen (NORCO) 7.5-325 MG tablet Take 1 tablet by mouth every 6 (six) hours as needed for moderate pain. (Patient not  taking: Reported on 05/25/2018) 12 tablet 0   No current facility-administered medications on file prior to visit.     Social History   Socioeconomic History  . Marital status: Single    Spouse name: Not on file  . Number of children: Not on file  . Years of education: Not on file  . Highest education level: Not on file  Occupational History  . Not on file  Social Needs  . Financial resource strain: Not on file  . Food insecurity:    Worry: Not on file    Inability: Not on file  . Transportation needs:    Medical: Not on file    Non-medical: Not on file  Tobacco Use  . Smoking status: Never Smoker  . Smokeless tobacco: Never Used  Substance and Sexual Activity  . Alcohol use: No  . Drug use: No  . Sexual activity: Not on file  Lifestyle  . Physical activity:    Days per week: Not on file    Minutes per session: Not on file  . Stress: Not on file  Relationships  . Social connections:    Talks on phone: Not on file    Gets together: Not on file    Attends religious service: Not on file    Active member of club or organization: Not on file    Attends meetings of clubs or organizations: Not on file    Relationship  status: Not on file  . Intimate partner violence:    Fear of current or ex partner: Not on file    Emotionally abused: Not on file    Physically abused: Not on file    Forced sexual activity: Not on file  Other Topics Concern  . Not on file  Social History Narrative  . Not on file    Family History  Problem Relation Age of Onset  . CVA Mother   . High blood pressure Father     BP 126/76   Pulse (!) 57   Ht 6\' 5"  (1.956 m)   Wt (!) 311 lb (141.1 kg)   BMI 36.88 kg/m   Body mass index is 36.88 kg/m.      Objective:   Physical Exam Constitutional:      Appearance: He is well-developed.  HENT:     Head: Normocephalic and atraumatic.  Eyes:     Conjunctiva/sclera: Conjunctivae normal.     Pupils: Pupils are equal, round, and reactive to  light.  Neck:     Musculoskeletal: Normal range of motion and neck supple.  Cardiovascular:     Rate and Rhythm: Normal rate and regular rhythm.  Pulmonary:     Effort: Pulmonary effort is normal.  Abdominal:     Palpations: Abdomen is soft.  Musculoskeletal:     Right knee: He exhibits decreased range of motion, swelling and effusion. Tenderness found. Medial joint line tenderness noted.       Legs:  Skin:    General: Skin is warm and dry.  Neurological:     Mental Status: He is alert and oriented to person, place, and time.     Cranial Nerves: No cranial nerve deficit.     Motor: No abnormal muscle tone.     Coordination: Coordination normal.     Deep Tendon Reflexes: Reflexes are normal and symmetric. Reflexes normal.  Psychiatric:        Behavior: Behavior normal.        Thought Content: Thought content normal.        Judgment: Judgment normal.       X-rays were done of the right knee, reported separately.    Assessment & Plan:   Encounter Diagnosis  Name Primary?  . Chronic pain of right knee Yes   PROCEDURE NOTE:  The patient request injection, verbal consent was obtained.  The right knee was prepped appropriately after time out was performed.   Sterile technique was observed and anesthesia was provided by ethyl chloride and a 20-gauge needle was used to inject the knee area.  A 16-gauge needle was then used to aspirate the knee.  Color of fluid aspirated was straw  Total cc's aspirated was 90.    Injection of 1 cc of Depo-Medrol 40 mg with several cc's of plain xylocaine was then performed.  A band aid dressing was applied.  The patient was advised to apply ice later today and tomorrow to the injection sight as needed.  I would like to get a MRI but his insurance requires six weeks of conservative treatment.  I am concerned about a medial meniscus tear.  Return in three weeks. Continue the ibuprofen.  Call if any problem.  Precautions  discussed.   Electronically Signed Darreld Mclean, MD 1/29/20208:55 AM

## 2018-05-25 NOTE — Addendum Note (Signed)
Addended by: Gevena Cotton on: 05/25/2018 03:57 PM   Modules accepted: Orders

## 2018-06-02 ENCOUNTER — Ambulatory Visit (HOSPITAL_COMMUNITY)
Admission: RE | Admit: 2018-06-02 | Discharge: 2018-06-02 | Disposition: A | Discharge status: Home / Self Care | Payer: BLUE CROSS/BLUE SHIELD | Source: Ambulatory Visit | Attending: Orthopaedic Surgery | Admitting: Orthopaedic Surgery

## 2018-06-02 DIAGNOSIS — M25561 Pain in right knee: Secondary | ICD-10-CM | POA: Insufficient documentation

## 2018-06-02 DIAGNOSIS — G8929 Other chronic pain: Secondary | ICD-10-CM | POA: Diagnosis present

## 2018-06-07 ENCOUNTER — Ambulatory Visit: Payer: BLUE CROSS/BLUE SHIELD | Admitting: Orthopaedic Surgery

## 2018-06-07 ENCOUNTER — Encounter: Payer: Self-pay | Admitting: Orthopaedic Surgery

## 2018-06-07 VITALS — BP 133/80 | HR 70 | Ht 77.0 in | Wt 313.0 lb

## 2018-06-07 DIAGNOSIS — M25561 Pain in right knee: Secondary | ICD-10-CM

## 2018-06-07 DIAGNOSIS — G8929 Other chronic pain: Secondary | ICD-10-CM

## 2018-06-07 NOTE — Progress Notes (Signed)
Patient XB:Adam Cowan, male DOB:1981/03/08, 38 y.o. GMW:[id number]  Chief Complaint  Patient presents with  . Knee Pain    right   . Results    review MRI     HPI  Adam Cowan is a 38 y.o. male who has worsening pain of the right knee laterally.  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 As noted above, fluid undermines a flap of hyaline cartilage along the lateral femoral condyle worrisome for fragment instability.  Degenerative maceration anterior horn of the lateral meniscus. The body of the lateral meniscus is extruded peripherally and degenerated.  Mucoid degeneration of the ACL with associated reactive marrow signal change in the tibia and femur. Negative for ligament tear.  I have explained the findings to him.  I used a model to show him.  I will have him see Dr. Romeo Apple for possible knee surgery.  He is agreeable to this.   Body mass index is 37.12 kg/m.  ROS  Review of Systems  Constitutional: Positive for activity change.  Musculoskeletal: Positive for arthralgias, gait problem and joint swelling.  All other systems reviewed and are negative.   All other systems reviewed and are negative.  The following is a summary of the past history medically, past history surgically, known current medicines, social history and family history.  This information is gathered electronically by the computer from prior information and documentation.  I review this each visit and have found including this information at this point in the chart is beneficial and informative.    History reviewed. No pertinent past medical history.  Past Surgical History:  Procedure Laterality Date  . HAND SURGERY     fracture - pins   . MEDIAL COLLATERAL LIGAMENT AND LATERAL COLLATERAL LIGAMENT REPAIR, ELBOW  2001   left knee    Family History  Problem Relation Age of Onset  .  CVA Mother   . High blood pressure Father     Social History Social History   Tobacco Use  . Smoking status: Never Smoker  . Smokeless tobacco: Never Used  Substance Use Topics  . Alcohol use: No  . Drug use: No    No Known Allergies  Current Outpatient Medications  Medication Sig Dispense Refill  . diclofenac (VOLTAREN) 75 MG EC tablet Take 1 tablet (75 mg total) by mouth 2 (two) times daily. Take with food (Patient not taking: Reported on 05/25/2018) 14 tablet 0  . HYDROcodone-acetaminophen (NORCO) 7.5-325 MG tablet Take 1 tablet by mouth every 6 (six) hours as needed for moderate pain. (Patient not taking: Reported on 05/25/2018) 12 tablet 0   No current facility-administered medications for this visit.      Physical Exam  Blood pressure 133/80, pulse 70, height 6\' 5"  (1.956 m), weight (!) 313 lb (142 kg).  Constitutional: overall normal hygiene, normal nutrition, well developed, normal grooming, normal body habitus. Assistive device:none  Musculoskeletal: gait and station Limp right, muscle tone and strength are normal, no tremors or atrophy is present.  .  Neurological: coordination overall normal.  Deep tendon reflex/nerve stretch intact.  Sensation normal.  Cranial nerves II-XII intact.   Skin:   Normal overall no scars, lesions, ulcers or rashes. No psoriasis.  Psychiatric: Alert and oriented x 3.  Recent memory intact, remote memory unclear.  Normal mood and affect. Well groomed.  Good eye contact.  Cardiovascular: overall no swelling, no varicosities, no edema  bilaterally, normal temperatures of the legs and arms, no clubbing, cyanosis and good capillary refill.  Lymphatic: palpation is normal.  Right knee has pain, ROM 0 to 110, lateral joint line pain, effusion, positive lateral McMurray, limp to the right, NV intact.  All other systems reviewed and are negative   The patient has been educated about the nature of the problem(s) and counseled on treatment  options.  The patient appeared to understand what I have discussed and is in agreement with it.  Encounter Diagnosis  Name Primary?  . Chronic pain of right knee Yes    PLAN Call if any problems.  Precautions discussed.  Continue current medications.   Return to clinic to see Dr. Romeo AppleHarrison for possible right knee surgery.   Electronically Signed Darreld McleanWayne Dayana Dalporto, MD 2/11/20208:30 AM

## 2018-06-15 ENCOUNTER — Encounter: Payer: Self-pay | Admitting: Orthopedic Surgery

## 2018-06-15 ENCOUNTER — Ambulatory Visit: Payer: BLUE CROSS/BLUE SHIELD | Admitting: Orthopaedic Surgery

## 2018-06-15 ENCOUNTER — Ambulatory Visit: Payer: BLUE CROSS/BLUE SHIELD | Admitting: Orthopedic Surgery

## 2018-06-15 VITALS — BP 135/82 | HR 64 | Ht 77.0 in | Wt 306.0 lb

## 2018-06-15 DIAGNOSIS — G8929 Other chronic pain: Secondary | ICD-10-CM

## 2018-06-15 DIAGNOSIS — M25561 Pain in right knee: Secondary | ICD-10-CM | POA: Diagnosis not present

## 2018-06-15 DIAGNOSIS — M2241 Chondromalacia patellae, right knee: Secondary | ICD-10-CM | POA: Diagnosis not present

## 2018-06-15 DIAGNOSIS — M23341 Other meniscus derangements, anterior horn of lateral meniscus, right knee: Secondary | ICD-10-CM | POA: Diagnosis not present

## 2018-06-15 NOTE — Patient Instructions (Signed)
Knee Arthroscopy  Knee arthroscopy is a surgery to examine the inside of the knee joint and repair any damage to cartilage, surfaces, and other soft tissues around the joint. You may have this surgery if non-surgical treatment has not relieved your symptoms. Knee arthroscopy may be used to:   Repair a torn ligament or other torn tissues. Ligaments are tissues that connect bones to each other.   Remove bone fragments.   Remove a fluid-filled sac (cyst).   Treat kneecap (patella)problems.   Treat an advanced infection in the knee (septicknee).  Arthroscopic surgery is done using a thin tube that has a light and camera on the end of it (arthroscope). The arthroscope is placed through a small incision, and the camera sends images to a screen in the operating room. The images are used to help perform the surgery.  Tell a health care provider about:   Any allergies you have.   All medicines you are taking, including vitamins, herbs, eye drops, creams, and over-the-counter medicines.   Any problems you or family members have had with anesthetic medicines.   Any blood disorders you have.   Any surgeries you have had.   Any medical conditions you have.   Whether you are pregnant or may be pregnant.  What are the risks?  Generally, this is a safe procedure. However, problems may occur, including:   Infection.   Bleeding.   Allergic reactions to medicines.   Damage to blood vessels, nerves, or tissues in the knee.   A blood clot that forms in the leg and travels to the lung (pulmonary embolism).   Failure of the surgery to relieve symptoms.   Knee stiffness.  What happens before the procedure?  Staying hydrated  Follow instructions from your health care provider about hydration, which may include:   Up to 2 hours before the procedure - you may continue to drink clear liquids, such as water, clear fruit juice, black coffee, and plain tea.  Eating and drinking restrictions  Follow instructions from your  health care provider about eating and drinking, which may include:   8 hours before the procedure - stop eating heavy meals or foods such as meat, fried foods, or fatty foods.   6 hours before the procedure - stop eating light meals or foods, such as toast or cereal.   6 hours before the procedure - stop drinking milk or drinks that contain milk.   2 hours before the procedure - stop drinking clear liquids.  Medicines  Ask your health care provider about:   Changing or stopping your regular medicines. This is especially important if you are taking diabetes medicines or blood thinners.   Taking medicines such as aspirin and ibuprofen. These medicines can thin your blood. Do not take these medicines unless your health care provider tells you to take them.   Taking over-the-counter medicines, vitamins, herbs, and supplements.  Testing   Your knee may be examined. Your health care provider may move your knee or ask you to move it in specific ways to see how much motion you have.   You may have blood tests.   You may have imaging tests, such as an X-ray, MRI, or CT scan.   You may have an electrocardiogram. This test records the electrical activity in the heart.  General instructions   Do not drink alcohol unless your health care provider says that you can.   Do not use any products that contain nicotine or tobacco, such   as cigarettes and e-cigarettes, for one month or more before your surgery. If you need help quitting, ask your health care provider.   Plan to have someone take you home from the hospital or clinic.   Plan to have a responsible adult care for you for at least 24 hours after you leave the hospital or clinic. This is important.  What happens during the procedure?     To lower your risk of infection:  ? Your health care team will wash or sanitize their hands.  ? Hair may be removed from the surgical area.  ? Your skin will be washed with soap.   An IV will be inserted into one of your  veins.   You will be given one or more of the following:  ? A medicine to help you relax (sedative).  ? A medicine to numb the knee area (local anesthetic).  ? A medicine to make you fall asleep (general anesthetic).  ? A medicine that is injected into an area of your body to numb everything below the injection site (regional anesthetic). This may be injected into your groin or thigh.   A cuff may be placed around your upper leg to slow blood flow to your lower leg during the procedure.   Several small incisions will be made around your knee.   Your knee joint will be rinsed (flushed) and filled with a germ-free solution (sterile saline). This expands the area to allow your surgeon to see the joint more clearly.   An arthroscope will be passed through one of your incisions, into your knee joint.   Other surgical instruments will be passed through the other incisions. Then, your surgeon will examine and repair your knee as needed.   The sterile saline will be drained from your knee, and the cuff will be removed from your upper leg.   Your incisions will be closed with adhesive strips or stitches (sutures) and covered with a bandage (dressing).  The procedure may vary among health care providers and hospitals.  What happens after the procedure?   Your blood pressure, heart rate, breathing rate, and blood oxygen level will be monitored until the medicines you were given have worn off.   You will be given pain medicine as needed.   You may be given medicine to lower your risk of blood clots.   You may have to wear compression stockings. These stockings help to prevent blood clots and reduce swelling in your legs.   Your health care provider will give you instructions about how much body weight you can safely support on your leg (weight-bearing restrictions). You may be given crutches or other devices to help you move around (assistive devices).   You may be shown how to do physical therapy exercises to  help you recover.   Do not drive until your health care provider approves.  Summary   Knee arthroscopy is a surgery to examine or repair the inside of the knee joint.   Before the procedure, follow instructions from your health care provider about eating and drinking.   Plan to have someone take you home from the hospital or clinic.  This information is not intended to replace advice given to you by your health care provider. Make sure you discuss any questions you have with your health care provider.  Document Released: 04/10/2000 Document Revised: 03/26/2017 Document Reviewed: 01/14/2017  [employer]vier Interactive Patient Education  2019 [employer]

## 2018-06-15 NOTE — Progress Notes (Signed)
PREOP CONSULT/REFERRAL INTRA-OFFICE FROM DR Gaylene Brooks   Chief Complaint  Patient presents with  . Knee Pain    right /surgical consult per Dr Hilda Lias     38 year old male had a left ACL reconstruction with allograft doing well right knee painful since January no trauma he took diclofenac did not help he could not take the hydrocodone for pain because of his job.  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  Complains of lateral pain anterior pain swelling knee gives out on him at times.  He is having transferred pain over to the left knee because of the right knee  Pain is best described as a dull deep ache   Review of Systems  Musculoskeletal: Negative for back pain.  Neurological: Negative for tingling.  All other systems reviewed and are negative.    History reviewed. No pertinent past medical history.  Past Surgical History:  Procedure Laterality Date  . HAND SURGERY     fracture - pins   . MEDIAL COLLATERAL LIGAMENT AND LATERAL COLLATERAL LIGAMENT REPAIR, ELBOW  2001   left knee    Family History  Problem Relation Age of Onset  . CVA Mother   . High blood pressure Father    Social History   Tobacco Use  . Smoking status: Never Smoker  . Smokeless tobacco: Never Used  Substance Use Topics  . Alcohol use: No  . Drug use: No    No Known Allergies   No outpatient medications have been marked as taking for the 06/15/18 encounter (Office Visit) with Vickki Hearing, MD.    BP 135/82   Pulse 64   Ht 6\' 5"  (1.956 m)   Wt (!) 306 lb (138.8 kg)   BMI 36.29 kg/m   Physical Exam Vitals signs reviewed.  Constitutional:      Appearance: Normal appearance. He is well-developed.  Skin:    General: Skin is warm and dry.     Findings: No erythema.  Neurological:     Mental Status: He is alert and oriented to person, place, and time.  Psychiatric:        Mood and Affect: Mood and affect normal.     Ortho Exam Left knee ACL scars from  the allograft with a good solid drawer full range of motion normal muscle tone neurovascular intact skin normal no effusion  Right knee effusion tenderness lateral joint line swelling palpable click positive McMurray's lateral meniscus, muscle tone normal.  ACL PCL lateral and medial collaterals intact no atrophy.  Skin normal.  Pulse and perfusion normal.   MEDICAL DECISION SECTION    My independent reading of xrays: Taken in the office show no major degenerative changes however  The MRI shows joint fluid chondral defect of the patella cartilage flap tear on the lateral femoral condyle and extruded lateral meniscus degeneration   Encounter Diagnoses  Name Primary?  . Chronic pain of right knee Yes  . 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 Chondromalacia, patella, right      PLAN:   Surgical procedure planned: ARTHROSCOPY RIGHT KNEE LATERAL MENISECTOMY   CAUTION DISCUSSED WITH HIM: A lot of arthritis for the age with the patellofemoral joint arthritic and lateral femoral condyle flap tear and arthritis and extruded lateral meniscus  The procedure has been fully reviewed with the patient; The risks and benefits of surgery have been discussed and explained and understood. Alternative treatment has also been  reviewed, questions were encouraged and answered. The postoperative plan is also been reviewed.  Nonsurgical treatment as described in the history and physical section was attempted and unsuccessful and the patient has agreed to proceed with surgical intervention to improve their situation.  No orders of the defined types were placed in this encounter.   Fuller Canada, MD 06/15/2018 3:25 PM

## 2018-11-29 ENCOUNTER — Telehealth: Payer: Self-pay | Admitting: Orthopedic Surgery

## 2018-11-29 NOTE — Telephone Encounter (Signed)
Patient requests to schedule surgery as discussed at visit 06/15/18. Please advise - [phone].

## 2018-11-30 ENCOUNTER — Other Ambulatory Visit: Payer: Self-pay | Admitting: Orthopedic Surgery

## 2018-11-30 NOTE — Telephone Encounter (Signed)
Left message for him to call me back so we can discuss surgery

## 2018-11-30 NOTE — Telephone Encounter (Signed)
Surgical procedure planned: ARTHROSCOPY RIGHT KNEE LATERAL MENISECTOMY   I will call him to schedule

## 2018-11-30 NOTE — Telephone Encounter (Signed)
Scheduled him for a right knee scope  12/08/2018 To you FYI

## 2018-12-01 NOTE — Patient Instructions (Signed)
Adam Cowan  12/01/2018     @PREFPERIOPPHARMACY @   Your procedure is scheduled on  12/08/2018.  Report to [facility] at  930  A.M.  Call this number if you have problems the morning of surgery:  [phone]   Remember:  Do not eat or drink after midnight.                        Take these medicines the morning of surgery with A SIP OF WATER  voltaren or hydrocodone.    Do not wear jewelry, make-up or nail polish.  Do not wear lotions, powders, or perfumes, or deodorant.  Do not shave 48 hours prior to surgery.  Men may shave face and neck.  Do not bring valuables to the hospital.  [facility] is not responsible for any belongings or valuables.  Contacts, dentures or bridgework may not be worn into surgery.  Leave your suitcase in the car.  After surgery it may be brought to your room.  For patients admitted to the hospital, discharge time will be determined by your treatment team.  Patients discharged the day of surgery will not be allowed to drive home.   Name and phone number of your driver:   family Special instructions:  None  Please read over the following fact sheets that you were given. Anesthesia Post-op Instructions and Care and Recovery After Surgery       Arthroscopic Knee Ligament Repair, Care After This sheet gives you information about how to care for yourself after your procedure. Your health care provider may also give you more specific instructions. If you have problems or questions, contact your health care provider. What can I expect after the procedure? After the procedure, it is common to have:  Pain in your knee.  Bruising and swelling on your knee, calf, and ankle for 3-4 days.  Fatigue. Follow these instructions at home: If you have a brace or immobilizer:  Wear the brace or immobilizer as told by your health care provider. Remove it only as told by your health care provider.  Loosen the splint or immobilizer if your toes  tingle, become numb, or turn cold and blue.  Keep the brace or immobilizer clean. Bathing  Do not take baths, swim, or use a hot tub until your health care provider approves. Ask your health care provider if you can take showers.  Keep your bandage (dressing) dry until your health care provider says that it can be removed. Cover it and your brace or immobilizer with a watertight covering when you take a shower. Incision care   Follow instructions from your health care provider about how to take care of your incision. Make sure you: ? Wash your hands with soap and water before you change your bandage (dressing). If soap and water are not available, use hand sanitizer. ? Change your dressing as told by your health care provider. ? Leave stitches (sutures), skin glue, or adhesive strips in place. These skin closures may need to stay in place for 2 weeks or longer. If adhesive strip edges start to loosen and curl up, you may trim the loose edges. Do not remove adhesive strips completely unless your health care provider tells you to do that.  Check your incision area every day for signs of infection. Check for: ? More redness, swelling, or pain. ? More fluid or blood. ? Warmth. ? Pus or a  bad smell. Managing pain, stiffness, and swelling   If directed, put ice on the affected area. ? If you have a removable brace or immobilizer, remove it as told by your health care provider. ? Put ice in a plastic bag. ? Place a towel between your skin and the bag or between your brace or immobilizer and the bag. ? Leave the ice on for 20 minutes, 2-3 times a day.  Move your toes often to avoid stiffness and to lessen swelling.  Raise (elevate) the injured area above the level of your heart while you are sitting or lying down. Driving  Do not drive until your health care provider approves. If you have a brace or immobilizer on your leg, ask your health care provider when it is safe for you to drive.   Do not drive or use heavy machinery while taking prescription pain medicine. Activity  Rest as directed. Ask your health care provider what activities are safe for you.  Do physical therapy exercises as told by your health care provider. Physical therapy will help you regain strength and motion in your knee.  Follow instructions from your health care provider about: ? When you may start motion exercises. ? When you may start riding a stationary bike and doing other low-impact activities. ? When you may start to jog and do other high-impact activities. Safety  Do not use the injured limb to support your body weight until your health care provider says that you can. Use crutches as told by your health care provider. General instructions  Do not use any products that contain nicotine or tobacco, such as cigarettes and e-cigarettes. These can delay bone healing. If you need help quitting, ask your health care provider.  To prevent or treat constipation while you are taking prescription pain medicine, your health care provider may recommend that you: ? Drink enough fluid to keep your urine clear or pale yellow. ? Take over-the-counter or prescription medicines. ? Eat foods that are high in fiber, such as fresh fruits and vegetables, whole grains, and beans. ? Limit foods that are high in fat and processed sugars, such as fried and sweet foods.  Take over-the-counter and prescription medicines only as told by your health care provider.  Keep all follow-up visits as told by your health care provider. This is important. Contact a health care provider if:  You have more redness, swelling, or pain around an incision.  You have more fluid or blood coming from an incision.  Your incision feels warm to the touch.  You have a fever.  You have pain or swelling in your knee, and it gets worse.  You have pain that does not get better with medicine. Get help right away if:  You have trouble  breathing.  You have pus or a bad smell coming from an incision.  You have numbness and tingling near the knee joint. Summary  After the procedure, it is common to have knee pain with bruising and swelling on your knee, calf, and ankle.  Icing your knee and raising your leg above the level of your heart will help control the pain and the swelling.  Do physical therapy exercises as told by your health care provider. Physical therapy will help you regain strength and motion in your knee. This information is not intended to replace advice given to you by your health care provider. Make sure you discuss any questions you have with your health care provider. Document Released: 02/01/2013 Document  Revised: 03/26/2017 Document Reviewed: 04/07/2016 Elsevier Patient Education  2020 [place] Anesthesia, Adult, Care After This sheet gives you information about how to care for yourself after your procedure. Your health care provider may also give you more specific instructions. If you have problems or questions, contact your health care provider. What can I expect after the procedure? After the procedure, the following side effects are common:  Pain or discomfort at the IV site.  Nausea.  Vomiting.  Sore throat.  Trouble concentrating.  Feeling cold or chills.  Weak or tired.  Sleepiness and fatigue.  Soreness and body aches. These side effects can affect parts of the body that were not involved in surgery. Follow these instructions at home:  For at least 24 hours after the procedure:  Have a responsible adult stay with you. It is important to have someone help care for you until you are awake and alert.  Rest as needed.  Do not: ? Participate in activities in which you could fall or become injured. ? Drive. ? Use heavy machinery. ? Drink alcohol. ? Take sleeping pills or medicines that cause drowsiness. ? Make important decisions or sign legal documents. ?  Take care of children on your own. Eating and drinking  Follow any instructions from your health care provider about eating or drinking restrictions.  When you feel hungry, start by eating small amounts of foods that are soft and easy to digest (bland), such as toast. Gradually return to your regular diet.  Drink enough fluid to keep your urine pale yellow.  If you vomit, rehydrate by drinking water, juice, or clear broth. General instructions  If you have sleep apnea, surgery and certain medicines can increase your risk for breathing problems. Follow instructions from your health care provider about wearing your sleep device: ? Anytime you are sleeping, including during daytime naps. ? While taking prescription pain medicines, sleeping medicines, or medicines that make you drowsy.  Return to your normal activities as told by your health care provider. Ask your health care provider what activities are safe for you.  Take over-the-counter and prescription medicines only as told by your health care provider.  If you smoke, do not smoke without supervision.  Keep all follow-up visits as told by your health care provider. This is important. Contact a health care provider if:  You have nausea or vomiting that does not get better with medicine.  You cannot eat or drink without vomiting.  You have pain that does not get better with medicine.  You are unable to pass urine.  You develop a skin rash.  You have a fever.  You have redness around your IV site that gets worse. Get help right away if:  You have difficulty breathing.  You have chest pain.  You have blood in your urine or stool, or you vomit blood. Summary  After the procedure, it is common to have a sore throat or nausea. It is also common to feel tired.  Have a responsible adult stay with you for the first 24 hours after general anesthesia. It is important to have someone help care for you until you are awake and  alert.  When you feel hungry, start by eating small amounts of foods that are soft and easy to digest (bland), such as toast. Gradually return to your regular diet.  Drink enough fluid to keep your urine pale yellow.  Return to your normal activities as told by your health care provider. Ask  your health care provider what activities are safe for you. This information is not intended to replace advice given to you by your health care provider. Make sure you discuss any questions you have with your health care provider. Document Released: 07/20/2000 Document Revised: 04/16/2017 Document Reviewed: 11/27/2016 Elsevier Patient Education  2020 [employer]. How to Use Chlorhexidine for Bathing Chlorhexidine gluconate (CHG) is a germ-killing (antiseptic) solution that is used to clean the skin. It can get rid of the bacteria that normally live on the skin and can keep them away for about 24 hours. To clean your skin with CHG, you may be given:  A CHG solution to use in the shower or as part of a sponge bath.  A prepackaged cloth that contains CHG. Cleaning your skin with CHG may help lower the risk for infection:  While you are staying in the intensive care unit of the hospital.  If you have a vascular access, such as a central line, to provide short-term or long-term access to your veins.  If you have a catheter to drain urine from your bladder.  If you are on a ventilator. A ventilator is a machine that helps you breathe by moving air in and out of your lungs.  After surgery. What are the risks? Risks of using CHG include:  A skin reaction.  Hearing loss, if CHG gets in your ears.  Eye injury, if CHG gets in your eyes and is not rinsed out.  The CHG product catching fire. Make sure that you avoid smoking and flames after applying CHG to your skin. Do not use CHG:  If you have a chlorhexidine allergy or have previously reacted to chlorhexidine.  On babies younger than 2 months of  age. How to use CHG solution  Use CHG only as told by your health care provider, and follow the instructions on the label.  Use the full amount of CHG as directed. Usually, this is one bottle. During a shower Follow these steps when using CHG solution during a shower (unless your health care provider gives you different instructions): 1. Start the shower. 2. Use your normal soap and shampoo to wash your face and hair. 3. Turn off the shower or move out of the shower stream. 4. Pour the CHG onto a clean washcloth. Do not use any type of brush or rough-edged sponge. 5. Starting at your neck, lather your body down to your toes. Make sure you follow these instructions: ? If you will be having surgery, pay special attention to the part of your body where you will be having surgery. Scrub this area for at least 1 minute. ? Do not use CHG on your head or face. If the solution gets into your ears or eyes, rinse them well with water. ? Avoid your genital area. ? Avoid any areas of skin that have broken skin, cuts, or scrapes. ? Scrub your back and under your arms. Make sure to wash skin folds. 6. Let the lather sit on your skin for 1-2 minutes or as long as told by your health care provider. 7. Thoroughly rinse your entire body in the shower. Make sure that all body creases and crevices are rinsed well. 8. Dry off with a clean towel. Do not put any substances on your body afterward-such as powder, lotion, or perfume-unless you are told to do so by your health care provider. Only use lotions that are recommended by the manufacturer. 9. Put on clean clothes or pajamas. 10.  If it is the night before your surgery, sleep in clean sheets.  During a sponge bath Follow these steps when using CHG solution during a sponge bath (unless your health care provider gives you different instructions): 1. Use your normal soap and shampoo to wash your face and hair. 2. Pour the CHG onto a clean washcloth. 3.  Starting at your neck, lather your body down to your toes. Make sure you follow these instructions: ? If you will be having surgery, pay special attention to the part of your body where you will be having surgery. Scrub this area for at least 1 minute. ? Do not use CHG on your head or face. If the solution gets into your ears or eyes, rinse them well with water. ? Avoid your genital area. ? Avoid any areas of skin that have broken skin, cuts, or scrapes. ? Scrub your back and under your arms. Make sure to wash skin folds. 4. Let the lather sit on your skin for 1-2 minutes or as long as told by your health care provider. 5. Using a different clean, wet washcloth, thoroughly rinse your entire body. Make sure that all body creases and crevices are rinsed well. 6. Dry off with a clean towel. Do not put any substances on your body afterward-such as powder, lotion, or perfume-unless you are told to do so by your health care provider. Only use lotions that are recommended by the manufacturer. 7. Put on clean clothes or pajamas. 8. If it is the night before your surgery, sleep in clean sheets. How to use CHG prepackaged cloths  Only use CHG cloths as told by your health care provider, and follow the instructions on the label.  Use the CHG cloth on clean, dry skin.  Do not use the CHG cloth on your head or face unless your health care provider tells you to.  When washing with the CHG cloth: ? Avoid your genital area. ? Avoid any areas of skin that have broken skin, cuts, or scrapes. Before surgery Follow these steps when using a CHG cloth to clean before surgery (unless your health care provider gives you different instructions): 1. Using the CHG cloth, vigorously scrub the part of your body where you will be having surgery. Scrub using a back-and-forth motion for 3 minutes. The area on your body should be completely wet with CHG when you are done scrubbing. 2. Do not rinse. Discard the cloth and let  the area air-dry. Do not put any substances on the area afterward, such as powder, lotion, or perfume. 3. Put on clean clothes or pajamas. 4. If it is the night before your surgery, sleep in clean sheets.  For general bathing Follow these steps when using CHG cloths for general bathing (unless your health care provider gives you different instructions). 1. Use a separate CHG cloth for each area of your body. Make sure you wash between any folds of skin and between your fingers and toes. Wash your body in the following order, switching to a new cloth after each step: ? The front of your neck, shoulders, and chest. ? Both of your arms, under your arms, and your hands. ? Your stomach and groin area, avoiding the genitals. ? Your right leg and foot. ? Your left leg and foot. ? The back of your neck, your back, and your buttocks. 2. Do not rinse. Discard the cloth and let the area air-dry. Do not put any substances on your body afterward-such as powder,  lotion, or perfume-unless you are told to do so by your health care provider. Only use lotions that are recommended by the manufacturer. 3. Put on clean clothes or pajamas. Contact a health care provider if:  Your skin gets irritated after scrubbing.  You have questions about using your solution or cloth. Get help right away if:  Your eyes become very red or swollen.  Your eyes itch badly.  Your skin itches badly and is red or swollen.  Your hearing changes.  You have trouble seeing.  You have swelling or tingling in your mouth or throat.  You have trouble breathing.  You swallow any chlorhexidine. Summary  Chlorhexidine gluconate (CHG) is a germ-killing (antiseptic) solution that is used to clean the skin. Cleaning your skin with CHG may help to lower your risk for infection.  You may be given CHG to use for bathing. It may be in a bottle or in a prepackaged cloth to use on your skin. Carefully follow your health care provider's  instructions and the instructions on the product label.  Do not use CHG if you have a chlorhexidine allergy.  Contact your health care provider if your skin gets irritated after scrubbing. This information is not intended to replace advice given to you by your health care provider. Make sure you discuss any questions you have with your health care provider. Document Released: 01/06/2012 Document Revised: 06/30/2018 Document Reviewed: 03/11/2017 Elsevier Patient Education  2020 [employer].

## 2018-12-05 ENCOUNTER — Other Ambulatory Visit (HOSPITAL_COMMUNITY)
Admission: RE | Admit: 2018-12-05 | Discharge: 2018-12-05 | Disposition: A | Discharge status: Home / Self Care | Payer: BC Managed Care – PPO | Source: Ambulatory Visit | Attending: Orthopedic Surgery | Admitting: Orthopedic Surgery

## 2018-12-05 ENCOUNTER — Encounter (HOSPITAL_COMMUNITY)
Admission: RE | Admit: 2018-12-05 | Discharge: 2018-12-05 | Disposition: A | Discharge status: Home / Self Care | Payer: BC Managed Care – PPO | Source: Ambulatory Visit | Attending: Orthopedic Surgery | Admitting: Orthopedic Surgery

## 2018-12-05 ENCOUNTER — Other Ambulatory Visit: Payer: Self-pay

## 2018-12-05 ENCOUNTER — Encounter (HOSPITAL_COMMUNITY): Payer: Self-pay

## 2018-12-05 DIAGNOSIS — Z20828 Contact with and (suspected) exposure to other viral communicable diseases: Secondary | ICD-10-CM | POA: Insufficient documentation

## 2018-12-05 DIAGNOSIS — X58XXXA Exposure to other specified factors, initial encounter: Secondary | ICD-10-CM | POA: Diagnosis not present

## 2018-12-05 DIAGNOSIS — J45909 Unspecified asthma, uncomplicated: Secondary | ICD-10-CM | POA: Diagnosis not present

## 2018-12-05 DIAGNOSIS — S83281A Other tear of lateral meniscus, current injury, right knee, initial encounter: Secondary | ICD-10-CM | POA: Diagnosis not present

## 2018-12-05 DIAGNOSIS — M1711 Unilateral primary osteoarthritis, right knee: Secondary | ICD-10-CM | POA: Diagnosis not present

## 2018-12-05 DIAGNOSIS — Z01812 Encounter for preprocedural laboratory examination: Secondary | ICD-10-CM | POA: Insufficient documentation

## 2018-12-05 DIAGNOSIS — M659 Synovitis and tenosynovitis, unspecified: Secondary | ICD-10-CM | POA: Diagnosis not present

## 2018-12-05 HISTORY — DX: Unspecified osteoarthritis, unspecified site: M19.90

## 2018-12-05 LAB — SARS CORONAVIRUS 2 (TAT 6-24 HRS): SARS Coronavirus 2: NEGATIVE

## 2018-12-05 LAB — CBC WITH DIFFERENTIAL/PLATELET
Abs Immature Granulocytes: 0 10*3/uL (ref 0.00–0.07)
Basophils Absolute: 0 10*3/uL (ref 0.0–0.1)
Basophils Relative: 0 %
Eosinophils Absolute: 0.2 10*3/uL (ref 0.0–0.5)
Eosinophils Relative: 2 %
HCT: 42.5 % (ref 39.0–52.0)
Hemoglobin: 13.2 g/dL (ref 13.0–17.0)
Immature Granulocytes: 0 %
Lymphocytes Relative: 27 %
Lymphs Abs: 1.9 10*3/uL (ref 0.7–4.0)
MCH: 24.7 pg — ABNORMAL LOW (ref 26.0–34.0)
MCHC: 31.1 g/dL (ref 30.0–36.0)
MCV: 79.6 fL — ABNORMAL LOW (ref 80.0–100.0)
Monocytes Absolute: 0.7 10*3/uL (ref 0.1–1.0)
Monocytes Relative: 9 %
Neutro Abs: 4.5 10*3/uL (ref 1.7–7.7)
Neutrophils Relative %: 62 %
Platelets: 271 10*3/uL (ref 150–400)
RBC: 5.34 MIL/uL (ref 4.22–5.81)
RDW: 17.8 % — ABNORMAL HIGH (ref 11.5–15.5)
WBC: 7.3 10*3/uL (ref 4.0–10.5)
nRBC: 0 % (ref 0.0–0.2)

## 2018-12-05 LAB — BASIC METABOLIC PANEL
Anion gap: 11 (ref 5–15)
BUN: 10 mg/dL (ref 6–20)
CO2: 22 mmol/L (ref 22–32)
Calcium: 8.6 mg/dL — ABNORMAL LOW (ref 8.9–10.3)
Chloride: 106 mmol/L (ref 98–111)
Creatinine, Ser: 0.91 mg/dL (ref 0.61–1.24)
GFR calc Af Amer: >60 mL/min (ref >60)
GFR calc non Af Amer: >60 mL/min (ref >60)
Glucose, Bld: 91 mg/dL (ref 70–99)
Potassium: 3.9 mmol/L (ref 3.5–5.1)
Sodium: 139 mmol/L (ref 135–145)

## 2018-12-05 NOTE — Pre-Procedure Instructions (Signed)
To whom it may concern,                                     Adam Cowan had a preoperative appointment today at [facility] at which time he was tested for COVID-19. He must remain in quarantine from now (12/05/2018) until the day of his surgery (12/08/2018), or his surgery will be canceled. If you have any questions, please feel free to contact me at [phone]. Thank you.                                                   Encarnacion Chu, RN

## 2018-12-07 NOTE — H&P (Signed)
PREOP CONSULT/REFERRAL INTRA-OFFICE FROM DR Gaylene BrooksJW KEELING         Chief Complaint  Patient presents with  . Knee Pain      right /surgical consult per Dr Hilda LiasKeeling      38 year old male had a left ACL reconstruction with allograft doing well right knee painful since January no trauma he took diclofenac did not help he could not take the hydrocodone for pain because of his job.  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  Complains of lateral pain anterior pain swelling knee gives out on him at times.  He is having transferred pain over to the left knee because of the right knee   Pain is best described as a dull deep ache     Review of Systems  Musculoskeletal: Negative for back pain.  Neurological: Negative for tingling.  All other systems reviewed and are negative.       History reviewed. No pertinent past medical history.        Past Surgical History:  Procedure Laterality Date  . HAND SURGERY        fracture - pins   . MEDIAL COLLATERAL LIGAMENT AND LATERAL COLLATERAL LIGAMENT REPAIR, ELBOW   2001    left knee          Family History  Problem Relation Age of Onset  . CVA Mother    . High blood pressure Father     Social History        Tobacco Use  . Smoking status: Never Smoker  . Smokeless tobacco: Never Used  Substance Use Topics  . Alcohol use: No  . Drug use: No     No Known Allergies     Active Medications  No outpatient medications have been marked as taking for the 06/15/18 encounter (Office Visit) with Vickki HearingHarrison, Lauryn Lizardi E, MD.       BP 135/82   Pulse 64   Ht 6\' 5"  (1.956 m)   Wt (!) 306 lb (138.8 kg)   BMI 36.29 kg/m    Physical Exam Vitals signs reviewed.  Constitutional:      Appearance: Normal appearance. He is well-developed.  Skin:    General: Skin is warm and dry.     Findings: No erythema.  Neurological:     Mental Status: He is alert and oriented to person, place, and time.  Psychiatric:        Mood and  Affect: Mood and affect normal.        Ortho Exam Left knee ACL scars from the allograft with a good solid drawer full range of motion normal muscle tone neurovascular intact skin normal no effusion   Right knee effusion tenderness lateral joint line swelling palpable click positive McMurray's lateral meniscus, muscle tone normal.  ACL PCL lateral and medial collaterals intact no atrophy.  Skin normal.  Pulse and perfusion normal.     MEDICAL DECISION SECTION      My independent reading of xrays: Taken in the office show no major degenerative changes however   The MRI shows joint fluid chondral defect of the patella cartilage flap tear on the lateral femoral condyle and extruded lateral meniscus degeneration         Encounter Diagnoses  Name Primary?  . Chronic pain of right knee Yes  . 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 Chondromalacia, patella, right         PLAN:  Surgical procedure planned: ARTHROSCOPY RIGHT KNEE LATERAL MENISECTOMY    CAUTION DISCUSSED WITH HIM: A lot of arthritis for the age with the patellofemoral joint arthritic and lateral femoral condyle flap tear and arthritis and extruded lateral meniscus   The procedure has been fully reviewed with the patient; The risks and benefits of surgery have been discussed and explained and understood. Alternative treatment has also been reviewed, questions were encouraged and answered. The postoperative plan is also been reviewed.   Nonsurgical treatment as described in the history and physical section was attempted and unsuccessful and the patient has agreed to proceed with surgical intervention to improve their situation.   No orders of the defined types were placed in this encounter.    Arther Abbott, MD 06/15/2018 3:25 PM

## 2018-12-07 NOTE — Progress Notes (Signed)
Patient notified to arrive at 645 on 12/08/18.  Verbalized understanding.

## 2018-12-08 ENCOUNTER — Encounter (HOSPITAL_COMMUNITY)
Admission: RE | Disposition: A | Discharge status: Home / Self Care | Payer: Self-pay | Source: Home / Self Care | Attending: Orthopedic Surgery

## 2018-12-08 ENCOUNTER — Ambulatory Visit (HOSPITAL_COMMUNITY)
Admission: RE | Admit: 2018-12-08 | Discharge: 2018-12-08 | Disposition: A | Discharge status: Home / Self Care | Payer: BC Managed Care – PPO | Attending: Orthopedic Surgery | Admitting: Orthopedic Surgery

## 2018-12-08 ENCOUNTER — Encounter (HOSPITAL_COMMUNITY): Payer: Self-pay

## 2018-12-08 ENCOUNTER — Ambulatory Visit (HOSPITAL_COMMUNITY): Payer: BC Managed Care – PPO | Admitting: Anesthesiology

## 2018-12-08 ENCOUNTER — Other Ambulatory Visit: Payer: Self-pay

## 2018-12-08 DIAGNOSIS — M23300 Other meniscus derangements, unspecified lateral meniscus, right knee: Secondary | ICD-10-CM

## 2018-12-08 DIAGNOSIS — J45909 Unspecified asthma, uncomplicated: Secondary | ICD-10-CM | POA: Diagnosis not present

## 2018-12-08 DIAGNOSIS — M1711 Unilateral primary osteoarthritis, right knee: Secondary | ICD-10-CM | POA: Insufficient documentation

## 2018-12-08 DIAGNOSIS — M2341 Loose body in knee, right knee: Secondary | ICD-10-CM | POA: Diagnosis not present

## 2018-12-08 DIAGNOSIS — S83281A Other tear of lateral meniscus, current injury, right knee, initial encounter: Secondary | ICD-10-CM | POA: Insufficient documentation

## 2018-12-08 DIAGNOSIS — M659 Synovitis and tenosynovitis, unspecified: Secondary | ICD-10-CM | POA: Insufficient documentation

## 2018-12-08 DIAGNOSIS — Z20828 Contact with and (suspected) exposure to other viral communicable diseases: Secondary | ICD-10-CM | POA: Insufficient documentation

## 2018-12-08 DIAGNOSIS — X58XXXA Exposure to other specified factors, initial encounter: Secondary | ICD-10-CM | POA: Diagnosis not present

## 2018-12-08 HISTORY — PX: CHONDROPLASTY: SHX5177

## 2018-12-08 HISTORY — PX: SYNOVECTOMY: SHX5180

## 2018-12-08 HISTORY — PX: KNEE ARTHROSCOPY WITH LATERAL MENISECTOMY: SHX6193

## 2018-12-08 SURGERY — ARTHROSCOPY, KNEE, WITH LATERAL MENISCECTOMY
Anesthesia: General | Laterality: Right

## 2018-12-08 MED ORDER — LACTATED RINGERS IV SOLN
INTRAVENOUS | Status: DC
  Administered 2018-12-08: 08:00:00 via INTRAVENOUS

## 2018-12-08 MED ORDER — METHYLPREDNISOLONE SODIUM SUCC 40 MG IJ SOLR
40.0000 mg | Freq: Once | INTRAMUSCULAR | Status: AC
  Administered 2018-12-08: 40 mg via INTRAVENOUS
  Filled 2018-12-08: qty 1

## 2018-12-08 MED ORDER — BUPIVACAINE-EPINEPHRINE (PF) 0.5% -1:200000 IJ SOLN
INTRAMUSCULAR | Status: AC
  Filled 2018-12-08: qty 60

## 2018-12-08 MED ORDER — GLYCOPYRROLATE PF 0.2 MG/ML IJ SOSY
PREFILLED_SYRINGE | INTRAMUSCULAR | Status: DC | PRN
  Administered 2018-12-08: .2 mg via INTRAVENOUS

## 2018-12-08 MED ORDER — ROCURONIUM BROMIDE 50 MG/5ML IV SOSY
PREFILLED_SYRINGE | INTRAVENOUS | Status: DC | PRN
  Administered 2018-12-08: 10 mg via INTRAVENOUS

## 2018-12-08 MED ORDER — BUPIVACAINE-EPINEPHRINE (PF) 0.5% -1:200000 IJ SOLN
INTRAMUSCULAR | Status: DC | PRN
  Administered 2018-12-08: 45 mL
  Administered 2018-12-08: 15 mL

## 2018-12-08 MED ORDER — DEXAMETHASONE SODIUM PHOSPHATE 4 MG/ML IJ SOLN
INTRAMUSCULAR | Status: DC | PRN
  Administered 2018-12-08: 10 mg via INTRAVENOUS

## 2018-12-08 MED ORDER — ACETAMINOPHEN 500 MG PO TABS
500.0000 mg | ORAL_TABLET | Freq: Once | ORAL | Status: AC
  Administered 2018-12-08: 500 mg via ORAL
  Filled 2018-12-08: qty 1

## 2018-12-08 MED ORDER — MIDAZOLAM HCL 2 MG/2ML IJ SOLN
INTRAMUSCULAR | Status: AC
  Filled 2018-12-08: qty 2

## 2018-12-08 MED ORDER — PROMETHAZINE HCL 12.5 MG PO TABS: 12.5000 mg | ORAL_TABLET | Freq: Four times a day (QID) | ORAL | 0 refills | Status: DC | PRN

## 2018-12-08 MED ORDER — PROPOFOL 10 MG/ML IV BOLUS
INTRAVENOUS | Status: DC | PRN
  Administered 2018-12-08: 200 mg via INTRAVENOUS

## 2018-12-08 MED ORDER — SUCCINYLCHOLINE CHLORIDE 20 MG/ML IJ SOLN
INTRAMUSCULAR | Status: DC | PRN
  Administered 2018-12-08: 180 mg via INTRAVENOUS

## 2018-12-08 MED ORDER — HYDROCODONE-ACETAMINOPHEN 7.5-325 MG PO TABS
1.0000 | ORAL_TABLET | Freq: Once | ORAL | Status: AC
  Administered 2018-12-08: 1 via ORAL
  Filled 2018-12-08: qty 1

## 2018-12-08 MED ORDER — EPINEPHRINE PF 1 MG/ML IJ SOLN
INTRAMUSCULAR | Status: AC
  Filled 2018-12-08: qty 2

## 2018-12-08 MED ORDER — LIDOCAINE 2% (20 MG/ML) 5 ML SYRINGE
INTRAMUSCULAR | Status: DC | PRN
  Administered 2018-12-08: 40 mg via INTRAVENOUS

## 2018-12-08 MED ORDER — SEVOFLURANE IN SOLN
RESPIRATORY_TRACT | Status: AC
  Filled 2018-12-08: qty 250

## 2018-12-08 MED ORDER — FENTANYL CITRATE (PF) 100 MCG/2ML IJ SOLN
INTRAMUSCULAR | Status: DC | PRN
  Administered 2018-12-08: 50 ug via INTRAVENOUS
  Administered 2018-12-08: 100 ug via INTRAVENOUS
  Administered 2018-12-08 (×2): 50 ug via INTRAVENOUS

## 2018-12-08 MED ORDER — CEFAZOLIN SODIUM-DEXTROSE 1-4 GM/50ML-% IV SOLN
INTRAVENOUS | Status: AC
  Filled 2018-12-08: qty 50

## 2018-12-08 MED ORDER — MIDAZOLAM HCL 2 MG/2ML IJ SOLN: 0.5000 mg | Freq: Once | INTRAMUSCULAR | Status: DC | PRN

## 2018-12-08 MED ORDER — SODIUM CHLORIDE 0.9 % IR SOLN
Status: DC | PRN
  Administered 2018-12-08 (×10): 3000 mL

## 2018-12-08 MED ORDER — DEXTROSE 5 % IV SOLN
3.0000 g | INTRAVENOUS | Status: DC
  Filled 2018-12-08: qty 3000

## 2018-12-08 MED ORDER — PROMETHAZINE HCL 25 MG/ML IJ SOLN
6.2500 mg | INTRAMUSCULAR | Status: DC | PRN
  Administered 2018-12-08: 6.25 mg via INTRAVENOUS
  Filled 2018-12-08: qty 1

## 2018-12-08 MED ORDER — ONDANSETRON HCL 4 MG/2ML IJ SOLN
INTRAMUSCULAR | Status: AC
  Filled 2018-12-08: qty 2

## 2018-12-08 MED ORDER — ONDANSETRON HCL 4 MG/2ML IJ SOLN
INTRAMUSCULAR | Status: DC | PRN
  Administered 2018-12-08: 4 mg via INTRAVENOUS

## 2018-12-08 MED ORDER — PROPOFOL 10 MG/ML IV BOLUS
INTRAVENOUS | Status: AC
  Filled 2018-12-08: qty 20

## 2018-12-08 MED ORDER — DEXAMETHASONE SODIUM PHOSPHATE 10 MG/ML IJ SOLN
INTRAMUSCULAR | Status: AC
  Filled 2018-12-08: qty 1

## 2018-12-08 MED ORDER — HYDROCODONE-ACETAMINOPHEN 7.5-325 MG PO TABS: 1.0000 | ORAL_TABLET | Freq: Once | ORAL | Status: DC | PRN

## 2018-12-08 MED ORDER — EPINEPHRINE PF 1 MG/ML IJ SOLN
INTRAMUSCULAR | Status: AC
  Filled 2018-12-08: qty 3

## 2018-12-08 MED ORDER — HYDROCODONE-ACETAMINOPHEN 7.5-325 MG PO TABS: 1.0000 | ORAL_TABLET | ORAL | 0 refills | Status: DC | PRN

## 2018-12-08 MED ORDER — EPINEPHRINE PF 1 MG/ML IJ SOLN
INTRAMUSCULAR | Status: AC
  Filled 2018-12-08: qty 5

## 2018-12-08 MED ORDER — CHLORHEXIDINE GLUCONATE 4 % EX LIQD: 60.0000 mL | Freq: Once | CUTANEOUS | Status: DC

## 2018-12-08 MED ORDER — DEXTROSE 5 % IV SOLN
INTRAVENOUS | Status: DC | PRN
  Administered 2018-12-08: 3 g via INTRAVENOUS

## 2018-12-08 MED ORDER — HYDROMORPHONE HCL 1 MG/ML IJ SOLN: 0.2500 mg | INTRAMUSCULAR | Status: DC | PRN

## 2018-12-08 MED ORDER — IBUPROFEN 800 MG PO TABS: 800.0000 mg | ORAL_TABLET | Freq: Three times a day (TID) | ORAL | 1 refills | Status: DC | PRN

## 2018-12-08 MED ORDER — FENTANYL CITRATE (PF) 250 MCG/5ML IJ SOLN
INTRAMUSCULAR | Status: AC
  Filled 2018-12-08: qty 5

## 2018-12-08 MED ORDER — CEFAZOLIN SODIUM-DEXTROSE 2-4 GM/100ML-% IV SOLN
INTRAVENOUS | Status: AC
  Filled 2018-12-08: qty 100

## 2018-12-08 SURGICAL SUPPLY — 44 items
BANDAGE ELASTIC 6 LF NS (GAUZE/BANDAGES/DRESSINGS) ×3 IMPLANT
BLADE 11 SAFETY STRL DISP (BLADE) ×3 IMPLANT
BLADE AGGRESSIVE PLUS 4.0 (BLADE) ×3 IMPLANT
CHLORAPREP W/TINT 26 (MISCELLANEOUS) ×3 IMPLANT
CLOTH BEACON ORANGE TIMEOUT ST (SAFETY) ×3 IMPLANT
COOLER CRYO IC GRAV AND TUBE (ORTHOPEDIC SUPPLIES) ×3 IMPLANT
COVER WAND RF STERILE (DRAPES) ×3 IMPLANT
CUFF CRYO KNEE LG 20X31 COOLER (ORTHOPEDIC SUPPLIES) ×3 IMPLANT
CUTTER TOMCAT 5.0MM (BURR) ×3 IMPLANT
DECANTER SPIKE VIAL GLASS SM (MISCELLANEOUS) ×6 IMPLANT
GAUZE 4X4 16PLY RFD (DISPOSABLE) ×3 IMPLANT
GAUZE SPONGE 4X4 12PLY STRL (GAUZE/BANDAGES/DRESSINGS) ×3 IMPLANT
GAUZE XEROFORM 5X9 LF (GAUZE/BANDAGES/DRESSINGS) ×3 IMPLANT
GLOVE BIO SURGEON STRL SZ7 (GLOVE) ×3 IMPLANT
GLOVE BIOGEL M 7.0 STRL (GLOVE) ×3 IMPLANT
GLOVE BIOGEL PI IND STRL 7.0 (GLOVE) ×2 IMPLANT
GLOVE BIOGEL PI INDICATOR 7.0 (GLOVE) ×4
GLOVE SKINSENSE NS SZ8.0 LF (GLOVE) ×2
GLOVE SKINSENSE STRL SZ8.0 LF (GLOVE) ×1 IMPLANT
GLOVE SS N UNI LF 8.5 STRL (GLOVE) ×3 IMPLANT
GOWN STRL REUS W/TWL LRG LVL3 (GOWN DISPOSABLE) ×3 IMPLANT
GOWN STRL REUS W/TWL XL LVL3 (GOWN DISPOSABLE) ×3 IMPLANT
IV NS IRRIG 3000ML ARTHROMATIC (IV SOLUTION) ×33 IMPLANT
KIT BLADEGUARD II DBL (SET/KITS/TRAYS/PACK) ×3 IMPLANT
KIT TURNOVER CYSTO (KITS) ×3 IMPLANT
MANIFOLD NEPTUNE II (INSTRUMENTS) ×3 IMPLANT
MARKER SKIN DUAL TIP RULER LAB (MISCELLANEOUS) ×3 IMPLANT
NEEDLE HYPO 18GX1.5 BLUNT FILL (NEEDLE) ×3 IMPLANT
NEEDLE HYPO 21X1.5 SAFETY (NEEDLE) ×3 IMPLANT
NEEDLE SPNL 18GX3.5 QUINCKE PK (NEEDLE) ×3 IMPLANT
NS IRRIG 1000ML POUR BTL (IV SOLUTION) ×3 IMPLANT
PACK ARTHRO LIMB DRAPE STRL (MISCELLANEOUS) ×3 IMPLANT
PAD ABD 5X9 TENDERSORB (GAUZE/BANDAGES/DRESSINGS) ×3 IMPLANT
PAD ARMBOARD 7.5X6 YLW CONV (MISCELLANEOUS) ×3 IMPLANT
PADDING CAST COTTON 6X4 STRL (CAST SUPPLIES) ×3 IMPLANT
PROBE BIPOLAR ATHRO 135MM 90D (MISCELLANEOUS) ×3 IMPLANT
SET ARTHROSCOPY INST (INSTRUMENTS) ×3 IMPLANT
SET BASIN LINEN APH (SET/KITS/TRAYS/PACK) ×3 IMPLANT
STOCKINETTE IMPERVIOUS LG (DRAPES) ×3 IMPLANT
SUT ETHILON 3 0 FSL (SUTURE) ×3 IMPLANT
SYR 30ML LL (SYRINGE) ×3 IMPLANT
TUBE CONNECTING 12'X1/4 (SUCTIONS) ×2
TUBE CONNECTING 12X1/4 (SUCTIONS) ×4 IMPLANT
TUBING ARTHRO INFLOW-ONLY STRL (TUBING) ×3 IMPLANT

## 2018-12-08 NOTE — Transfer of Care (Signed)
Immediate Anesthesia Transfer of Care Note  Patient: Adam Cowan  Procedure(s) Performed: RIGHT KNEE ARTHROSCOPY WITH LATERAL MENISCECTOMY AND REMOVAL OF LOOSE BODIES (Right ) SYNOVECTOMY (Right ) CHONDROPLASTY LATERAL FEMUR AND PATELLA (Right )  Patient Location: PACU  Anesthesia Type:General  Level of Consciousness: awake, alert , oriented and patient cooperative  Airway & Oxygen Therapy: Patient Spontanous Breathing  Post-op Assessment: Report given to RN and Post -op Vital signs reviewed and stable  Post vital signs: Reviewed and stable  Last Vitals:  Vitals Value Taken Time  BP 130/79 12/08/18 1020  Temp 36.6 C 12/08/18 1020  Pulse 59 12/08/18 1025  Resp 16 12/08/18 1025  SpO2 93 % 12/08/18 1025  Vitals shown include unvalidated device data.  Last Pain:  Vitals:   12/08/18 1020  TempSrc:   PainSc: (P) Asleep      Patients Stated Pain Goal: 8 (33/54/56 [id number])  Complications: No apparent anesthesia complications

## 2018-12-08 NOTE — Discharge Instructions (Signed)

## 2018-12-08 NOTE — OR Nursing (Signed)
Patient's family updated on procedure at 0930am.

## 2018-12-08 NOTE — Interval H&P Note (Signed)
History and Physical Interval Note:  12/08/2018 8:29 AM  Adam Cowan  has presented today for surgery, with the diagnosis of Right knee lateral meniscus tear.  The various methods of treatment have been discussed with the patient and family. After consideration of risks, benefits and other options for treatment, the patient has consented to  Procedure(s): RIGHT KNEE ARTHROSCOPY WITH LATERAL MENISCECTOMY (Right) as a surgical intervention.  The patient's history has been reviewed, patient examined, no change in status, stable for surgery.  I have reviewed the patient's chart and labs.  Questions were answered to the patient's satisfaction.     Arther Abbott

## 2018-12-08 NOTE — Anesthesia Procedure Notes (Signed)
Procedure Name: Intubation Date/Time: 12/08/2018 8:38 AM Performed by: Andree Elk, Shealyn Sean A, CRNA Pre-anesthesia Checklist: Patient identified, Patient being monitored, Timeout performed, Emergency Drugs available and Suction available Patient Re-evaluated:Patient Re-evaluated prior to induction Oxygen Delivery Method: Circle system utilized Preoxygenation: Pre-oxygenation with 100% oxygen Induction Type: IV induction Laryngoscope Size: 3 and Glidescope Grade View: Grade I Tube type: Oral Tube size: 7.0 mm Number of attempts: 1 Airway Equipment and Method: Stylet Placement Confirmation: ETT inserted through vocal cords under direct vision,  positive ETCO2 and breath sounds checked- equal and bilateral Secured at: 21 cm Tube secured with: Tape Dental Injury: Teeth and Oropharynx as per pre-operative assessment

## 2018-12-08 NOTE — Anesthesia Preprocedure Evaluation (Signed)
Anesthesia Evaluation  Patient identified by MRN, date of birth, ID band Patient awake    Reviewed: Allergy & Precautions, NPO status , Patient's Chart, lab work & pertinent test results  Airway Mallampati: I  TM Distance: >3 FB Neck ROM: Full    Dental no notable dental hx. (+) Teeth Intact   Pulmonary asthma ,  States hasnt used inhaler in over 5 years    Pulmonary exam normal breath sounds clear to auscultation       Cardiovascular Exercise Tolerance: Good negative cardio ROS Normal cardiovascular examI Rhythm:Regular Rate:Normal     Neuro/Psych negative neurological ROS  negative psych ROS   GI/Hepatic negative GI ROS, Neg liver ROS,   Endo/Other  negative endocrine ROS  Renal/GU negative Renal ROS  negative genitourinary   Musculoskeletal  (+) Arthritis , Osteoarthritis,    Abdominal   Peds negative pediatric ROS (+)  Hematology negative hematology ROS (+)   Anesthesia Other Findings   Reproductive/Obstetrics negative OB ROS                             Anesthesia Physical Anesthesia Plan  ASA: II  Anesthesia Plan: General   Post-op Pain Management:    Induction: Intravenous  PONV Risk Score and Plan: 2 and Ondansetron, Dexamethasone, Treatment may vary due to age or medical condition and Midazolam  Airway Management Planned: Oral ETT and LMA  Additional Equipment:   Intra-op Plan:   Post-operative Plan: Extubation in OR  Informed Consent: I have reviewed the patients History and Physical, chart, labs and discussed the procedure including the risks, benefits and alternatives for the proposed anesthesia with the patient or authorized representative who has indicated his/her understanding and acceptance.     Dental advisory given  Plan Discussed with: CRNA  Anesthesia Plan Comments: (Plan Full PPE use  Plan GA (LMA) vs GETA d/w pt -WTP with same after Q&A)         Anesthesia Quick Evaluation

## 2018-12-08 NOTE — Brief Op Note (Signed)
12/08/2018  10:08 AM  PATIENT:  Nydia Bouton  38 y.o. male  PRE-OPERATIVE DIAGNOSIS:  Right knee lateral meniscus tear  POST-OPERATIVE DIAGNOSIS:  Right knee lateral meniscus tear, loose bodies, synovitis, arthritis  PROCEDURE:  Procedure(s): RIGHT KNEE ARTHROSCOPY WITH LATERAL MENISCECTOMY AND REMOVAL OF LOOSE BODIES (Right) SYNOVECTOMY (Right) CHONDROPLASTY LATERAL FEMUR AND PATELLA (Right)   What did we find:  Torn lateral meniscus anterior horn and body Grade 3 chondral defect lateral femoral condyle Multiple loose bodies in the lateral gutter which were cartilaginous in nature Extensive synovitis throughout the joint Normal medial meniscus normal medial cartilage Normal ACL and PCL  SURGEON:  Surgeon(s) and Role:    Carole Civil, MD - Primary  How was the surgery done: The patient was seen in the preop area and his identity was confirmed and his surgical site was confirmed and marked his right knee.  A thorough chart review was completed including imaging  He was taken to the operating room he was given 3 g of Ancef and he was placed under general anesthesia.  Position supine.  Arthroscopic leg holder right knee padding left knee  The knee was prepped and draped sterilely with DuraPrep  Timeout was taken site was confirmed patient was confirm imaging confirm surgery confirmed  I made a lateral portal I placed the scope into the joint.  I performed a diagnostic arthroscopy by imaging the entire knee.  I started with the lateral meniscus and used a motorized shaver through a medial portal to remove the meniscal tissue that was torn which included the anterior horn partial body partial.  I also removed some loose pieces of cartilage.  I used a 90 degree ArthroCare wand to balance the meniscus and confirmed a stable rim with the probe  I then did a chondroplasty on the lateral femoral condyle.  As I was irrigating the joint to close I found 20 pieces of  cartilage articular in nature, in the lateral gutter and I sucked all of those out which took quite a bit of time.  I then did a synovectomy as I was looking for the loose pieces of cartilage.  After I found these cartilage pieces I felt that they were contributing to the synovitis and therefore did a extensive synovectomy.  I also removed bone from the medial femoral condyle in the form of a bone spur.  I then found other cartilaginous irregularities on the lateral facet involving the entire facet and did a chondroplasty there as well  I then irrigated the joint injected with 60 cc of Marcaine with epinephrine and closed the portals with 3-0 nylon  Sterile dressing was applied Cryo/Cuff was placed and activated  Patient taken to recovery room after extubation in stable condition  PHYSICIAN ASSISTANT:   ASSISTANTS: none   ANESTHESIA:   general  EBL:  5 mL   BLOOD ADMINISTERED:none  DRAINS: none   LOCAL MEDICATIONS USED:  MARCAINE     SPECIMEN:  No Specimen  DISPOSITION OF SPECIMEN:  N/A  COUNTS:  YES  TOURNIQUET:  * Missing tourniquet times found for documented tourniquets in log: [id number] *  DICTATION: .Viviann Spare Dictation  PLAN OF CARE: Discharge to home after PACU  PATIENT DISPOSITION:  PACU - hemodynamically stable.   Delay start of Pharmacological VTE agent (>24hrs) due to surgical blood loss or risk of bleeding: not applicable

## 2018-12-08 NOTE — Anesthesia Postprocedure Evaluation (Signed)
Anesthesia Post Note  Patient: Adam Cowan  Procedure(s) Performed: RIGHT KNEE ARTHROSCOPY WITH LATERAL MENISCECTOMY AND REMOVAL OF LOOSE BODIES (Right ) SYNOVECTOMY (Right ) CHONDROPLASTY LATERAL FEMUR AND PATELLA (Right )  Patient location during evaluation: PACU Anesthesia Type: General Level of consciousness: awake and alert, oriented and patient cooperative Pain management: pain level controlled Vital Signs Assessment: post-procedure vital signs reviewed and stable Respiratory status: spontaneous breathing Cardiovascular status: stable Postop Assessment: no apparent nausea or vomiting Anesthetic complications: no     Last Vitals:  Vitals:   12/08/18 1030 12/08/18 1045  BP: 123/73 (!) 93/48  Pulse: (!) 53 (!) 56  Resp: 16 16  Temp:    SpO2: 95% 96%    Last Pain:  Vitals:   12/08/18 1045  TempSrc:   PainSc: 0-No pain                 ADAMS, AMY A

## 2018-12-08 NOTE — Op Note (Signed)
12/08/2018  10:08 AM  PATIENT:  Adam Cowan  38 y.o. male  PRE-OPERATIVE DIAGNOSIS:  Right knee lateral meniscus tear  POST-OPERATIVE DIAGNOSIS:  Right knee lateral meniscus tear, loose bodies, synovitis, arthritis  PROCEDURE:  Procedure(s): RIGHT KNEE ARTHROSCOPY WITH LATERAL MENISCECTOMY - 29881 AND REMOVAL OF LOOSE BODIES (Right) - [phone] SYNOVECTOMY (Right) - 97989 CHONDROPLASTY LATERAL FEMUR AND PATELLA (Right) - 21194  What did we find:  Torn lateral meniscus anterior horn and body Grade 3 chondral defect lateral femoral condyle Multiple loose bodies in the lateral gutter which were cartilaginous in nature Extensive synovitis throughout the joint Normal medial meniscus normal medial cartilage Normal ACL and PCL  SURGEON:  Surgeon(s) and Role:    Carole Civil, MD - Primary  How was the surgery done: The patient was seen in the preop area and his identity was confirmed and his surgical site was confirmed and marked his right knee.  A thorough chart review was completed including imaging  He was taken to the operating room he was given 3 g of Ancef and he was placed under general anesthesia.  Position supine.  Arthroscopic leg holder right knee padding left knee  The knee was prepped and draped sterilely with DuraPrep  Timeout was taken site was confirmed patient was confirm imaging confirm surgery confirmed  I made a lateral portal I placed the scope into the joint.  I performed a diagnostic arthroscopy by imaging the entire knee.  I started with the lateral meniscus and used a motorized shaver through a medial portal to remove the meniscal tissue that was torn which included the anterior horn partial body partial.  I also removed some loose pieces of cartilage.  I used a 90 degree ArthroCare wand to balance the meniscus and confirmed a stable rim with the probe  I then did a chondroplasty on the lateral femoral condyle.  As I was irrigating the joint to  close I found 20 pieces of cartilage articular in nature, in the lateral gutter and I sucked all of those out which took quite a bit of time.  I then did a synovectomy as I was looking for the loose pieces of cartilage.  After I found these cartilage pieces I felt that they were contributing to the synovitis and therefore did a extensive synovectomy.  I also removed bone from the medial femoral condyle in the form of a bone spur.  I then found other cartilaginous irregularities on the lateral facet involving the entire facet and did a chondroplasty there as well  I then irrigated the joint injected with 60 cc of Marcaine with epinephrine and closed the portals with 3-0 nylon  Sterile dressing was applied Cryo/Cuff was placed and activated  Patient taken to recovery room after extubation in stable condition  PHYSICIAN ASSISTANT:   ASSISTANTS: none   ANESTHESIA:   general  EBL:  5 mL   BLOOD ADMINISTERED:none  DRAINS: none   LOCAL MEDICATIONS USED:  MARCAINE     SPECIMEN:  No Specimen  DISPOSITION OF SPECIMEN:  N/A  COUNTS:  YES  TOURNIQUET:  * Missing tourniquet times found for documented tourniquets in log: [id number] *  DICTATION: .Viviann Spare Dictation  PLAN OF CARE: Discharge to home after PACU  PATIENT DISPOSITION:  PACU - hemodynamically stable.   Delay start of Pharmacological VTE agent (>24hrs) due to surgical blood loss or risk of bleeding: not applicable

## 2018-12-09 ENCOUNTER — Encounter (HOSPITAL_COMMUNITY): Payer: Self-pay | Admitting: Orthopedic Surgery

## 2018-12-14 ENCOUNTER — Encounter: Payer: Self-pay | Admitting: Orthopedic Surgery

## 2018-12-15 ENCOUNTER — Ambulatory Visit (INDEPENDENT_AMBULATORY_CARE_PROVIDER_SITE_OTHER): Payer: BC Managed Care – PPO | Admitting: Orthopedic Surgery

## 2018-12-15 ENCOUNTER — Ambulatory Visit: Payer: BC Managed Care – PPO | Admitting: Orthopaedic Surgery

## 2018-12-15 ENCOUNTER — Other Ambulatory Visit: Payer: Self-pay

## 2018-12-15 ENCOUNTER — Encounter: Payer: Self-pay | Admitting: Orthopedic Surgery

## 2018-12-15 DIAGNOSIS — Z9889 Other specified postprocedural states: Secondary | ICD-10-CM

## 2018-12-15 MED ORDER — HYDROCODONE-ACETAMINOPHEN 5-325 MG PO TABS: 1.0000 | ORAL_TABLET | Freq: Four times a day (QID) | ORAL | 0 refills | Status: DC | PRN

## 2018-12-15 NOTE — Patient Instructions (Signed)
Ice several times a day   Exercises   Its ok to take hydrocodone for 1 more week then use ibuprofen and tylenol

## 2018-12-15 NOTE — Progress Notes (Signed)
POSTOP VISIT  POD # 7 Chief complaint postop visit #1 status post arthroscopy right knee with lateral meniscectomy removal of multiple loose bodies chondroplasty of the patella and lateral femur and synovectomy   Adam Cowan says that he was feeling so good he went driving the next day and then that night has had so much pain he might of thought about going to the ER but it has since calmed down   Encounter Diagnosis  Name Primary?  . S/P right knee arthroscopy-lateral meniscectomy chondroplasty femur lateral and patella synovectomy and removal of loose bodies Yes    Knee has effusion 1+, sutures were removed from the portals which were clean without drainage  He was ambulatory with crutches  Postoperative plan (Work, [employer],  Meds ordered this encounter  Medications  . HYDROcodone-acetaminophen (NORCO/VICODIN) 5-325 MG tablet    Sig: Take 1 tablet by mouth every 6 (six) hours as needed for moderate pain.    Dispense:  30 tablet    Refill:  0  ,FU)    Fu 3 weeks  HEP/PEP

## 2019-01-06 ENCOUNTER — Encounter: Payer: Self-pay | Admitting: Orthopedic Surgery

## 2019-01-06 ENCOUNTER — Ambulatory Visit (INDEPENDENT_AMBULATORY_CARE_PROVIDER_SITE_OTHER): Payer: BC Managed Care – PPO | Admitting: Orthopedic Surgery

## 2019-01-06 ENCOUNTER — Other Ambulatory Visit: Payer: Self-pay

## 2019-01-06 DIAGNOSIS — M23341 Other meniscus derangements, anterior horn of lateral meniscus, right knee: Secondary | ICD-10-CM

## 2019-01-06 DIAGNOSIS — M25561 Pain in right knee: Secondary | ICD-10-CM

## 2019-01-06 DIAGNOSIS — Z9889 Other specified postprocedural states: Secondary | ICD-10-CM

## 2019-01-06 DIAGNOSIS — G8918 Other acute postprocedural pain: Secondary | ICD-10-CM

## 2019-01-06 DIAGNOSIS — G8929 Other chronic pain: Secondary | ICD-10-CM

## 2019-01-06 MED ORDER — IBUPROFEN 800 MG PO TABS: 800.0000 mg | ORAL_TABLET | Freq: Three times a day (TID) | ORAL | 1 refills | Status: AC | PRN

## 2019-01-06 MED ORDER — HYDROCODONE-ACETAMINOPHEN 5-325 MG PO TABS: 1.0000 | ORAL_TABLET | Freq: Four times a day (QID) | ORAL | 0 refills | Status: DC | PRN

## 2019-01-06 NOTE — Addendum Note (Signed)
Addended byCandice Camp on: 01/06/2019 11:28 AM   Modules accepted: Orders

## 2019-01-06 NOTE — Patient Instructions (Signed)
Continue ibuprofen and hydrocodone as needed  Continue Ace wrap as needed  Start physical therapy.  He will get a call from their office to make an appointment please start as soon as possible  Out of work note will change from September 25 to return to work 10/12

## 2019-01-06 NOTE — Progress Notes (Signed)
Chief Complaint  Patient presents with  . Post-op Follow-up    s/p knee scope increased pain past couple days 12/08/18    Pod # 29  Comes in still on crutches ???  Meds ordered this encounter  Medications  . HYDROcodone-acetaminophen (NORCO/VICODIN) 5-325 MG tablet    Sig: Take 1 tablet by mouth every 6 (six) hours as needed for moderate pain.    Dispense:  30 tablet    Refill:  0  . ibuprofen (ADVIL) 800 MG tablet    Sig: Take 1 tablet (800 mg total) by mouth every 8 (eight) hours as needed.    Dispense:  90 tablet    Refill:  1    He bends his knee 115 degrees extends fully he has a little small effusion  He rates his  pain 5 out of 10 at night and not so much during the day Warm to start some physical therapy will change his work note to return to work 02/06/2019 and follow-up here on 02/03/2019 he can continue the medications and the home exercises   Encounter Diagnoses  Name Primary?  . S/P right knee arthroscopy 12/08/18 -lateral meniscectomy chondroplasty femur lateral and patella synovectomy and removal of loose bodies   . 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 Chronic pain of right knee   . Post-operative pain Yes    OP REPORT  12/08/2018  10:08 AM  PATIENT:  Adam Cowan  38 y.o. male  PRE-OPERATIVE DIAGNOSIS:  Right knee lateral meniscus tear  POST-OPERATIVE DIAGNOSIS:  Right knee lateral meniscus tear, loose bodies, synovitis, arthritis  PROCEDURE:  Procedure(s): RIGHT KNEE ARTHROSCOPY WITH LATERAL MENISCECTOMY - 29881 AND REMOVAL OF LOOSE BODIES (Right) - [phone] SYNOVECTOMY (Right) - 09735 CHONDROPLASTY LATERAL FEMUR AND PATELLA (Right) - 32992  What did we find:  Torn lateral meniscus anterior horn and body Grade 3 chondral defect lateral femoral condyle Multiple loose bodies in the lateral gutter which were cartilaginous in nature Extensive synovitis throughout the joint Normal medial meniscus normal medial  cartilage Normal ACL and PCL

## 2019-01-24 ENCOUNTER — Telehealth: Payer: Self-pay | Admitting: Orthopedic Surgery

## 2019-01-24 NOTE — Telephone Encounter (Signed)
Patient came to office regarding forms process, namely, current short term disability claim with [employer]; patient also presented with form from [facility]. Re-discussed process ([employer] form process). Patient also relays his [employer] claim is pending information from our clinic. I in turn called [employer] at ph# [phone], and spoke with representative RaDeena, who relayed they did not receive our faxed completed claim/medical records which were faxed on 12/30/18.  Re-Faxed as "Urgent" with updated information included. Pt aware.

## 2019-02-03 ENCOUNTER — Other Ambulatory Visit: Payer: Self-pay

## 2019-02-03 ENCOUNTER — Encounter: Payer: Self-pay | Admitting: Orthopedic Surgery

## 2019-02-03 ENCOUNTER — Ambulatory Visit (INDEPENDENT_AMBULATORY_CARE_PROVIDER_SITE_OTHER): Payer: BC Managed Care – PPO | Admitting: Orthopedic Surgery

## 2019-02-03 DIAGNOSIS — M23341 Other meniscus derangements, anterior horn of lateral meniscus, right knee: Secondary | ICD-10-CM

## 2019-02-03 DIAGNOSIS — M25561 Pain in right knee: Secondary | ICD-10-CM

## 2019-02-03 DIAGNOSIS — Z9889 Other specified postprocedural states: Secondary | ICD-10-CM

## 2019-02-03 DIAGNOSIS — G8918 Other acute postprocedural pain: Secondary | ICD-10-CM

## 2019-02-03 DIAGNOSIS — G8929 Other chronic pain: Secondary | ICD-10-CM

## 2019-02-03 MED ORDER — HYDROCODONE-ACETAMINOPHEN 5-325 MG PO TABS: 1.0000 | ORAL_TABLET | Freq: Three times a day (TID) | ORAL | 0 refills | Status: AC | PRN

## 2019-02-03 NOTE — Patient Instructions (Signed)
Return to work  

## 2019-02-03 NOTE — Progress Notes (Signed)
Chief Complaint  Patient presents with  . Routine Post Op    12/08/2018 right knee scope    Adam Cowan is doing better with his knees walking without a limp no breaks  No swelling  He is ready go back to work  Straight leg raise is normal his knee feels stable he has no effusion  He can return to work as soon as he can get back on the schedule  I did give him some medication for pain which he can use judiciously and moving forward should go back to NSAIDs   Meds ordered this encounter  Medications  . HYDROcodone-acetaminophen (NORCO/VICODIN) 5-325 MG tablet    Sig: Take 1 tablet by mouth every 8 (eight) hours as needed for moderate pain.    Dispense:  30 tablet    Refill:  0

## 2019-05-04 ENCOUNTER — Ambulatory Visit: Payer: BC Managed Care – PPO | Attending: Internal Medicine

## 2019-05-04 ENCOUNTER — Other Ambulatory Visit: Payer: Self-pay

## 2019-05-04 DIAGNOSIS — Z20822 Contact with and (suspected) exposure to covid-19: Secondary | ICD-10-CM

## 2019-05-06 LAB — NOVEL CORONAVIRUS, NAA: SARS-CoV-2, NAA: NOT DETECTED

## 2019-05-08 ENCOUNTER — Telehealth: Payer: Self-pay | Admitting: *Deleted

## 2019-05-08 NOTE — Telephone Encounter (Signed)
Reviewed negative Covid results with the patient. No further questions. 

## 2019-05-18 ENCOUNTER — Ambulatory Visit: Payer: BC Managed Care – PPO | Attending: Internal Medicine

## 2019-05-18 ENCOUNTER — Other Ambulatory Visit: Payer: Self-pay

## 2019-05-18 DIAGNOSIS — Z20822 Contact with and (suspected) exposure to covid-19: Secondary | ICD-10-CM

## 2019-05-19 LAB — NOVEL CORONAVIRUS, NAA: SARS-CoV-2, NAA: NOT DETECTED

## 2019-05-22 ENCOUNTER — Telehealth: Payer: Self-pay | Admitting: *Deleted

## 2019-05-22 NOTE — Telephone Encounter (Signed)
Pt given result of COVID test on 05/18/19; he verbalized understanding

## 2019-06-01 ENCOUNTER — Other Ambulatory Visit: Payer: BC Managed Care – PPO

## 2019-12-22 ENCOUNTER — Other Ambulatory Visit: Payer: Self-pay

## 2019-12-22 ENCOUNTER — Other Ambulatory Visit: Payer: BC Managed Care – PPO

## 2019-12-22 DIAGNOSIS — Z20822 Contact with and (suspected) exposure to covid-19: Secondary | ICD-10-CM

## 2019-12-23 LAB — SARS-COV-2, NAA 2 DAY TAT

## 2019-12-23 LAB — NOVEL CORONAVIRUS, NAA: SARS-CoV-2, NAA: DETECTED — AB

## 2020-08-15 IMAGING — MR MR KNEE*R* W/O CM
4 of 7 series · 15 of 40 positions shown · non-contrast
Comparison: Plain films right knee 05/25/2018.

CLINICAL DATA: Right knee pain and swelling for 1 month. No known
injury.

EXAM:
MRI OF THE RIGHT KNEE WITHOUT CONTRAST
TECHNIQUE: Multiplanar, multisequence MR imaging of the knee was performed. No
intravenous contrast was administered.

[Series 4: t2fs axial · axial · 4.0mm · 0.27mm/px · z∈[-57,+87]mm · 3 of 30 slices shown]
[im 1/30]
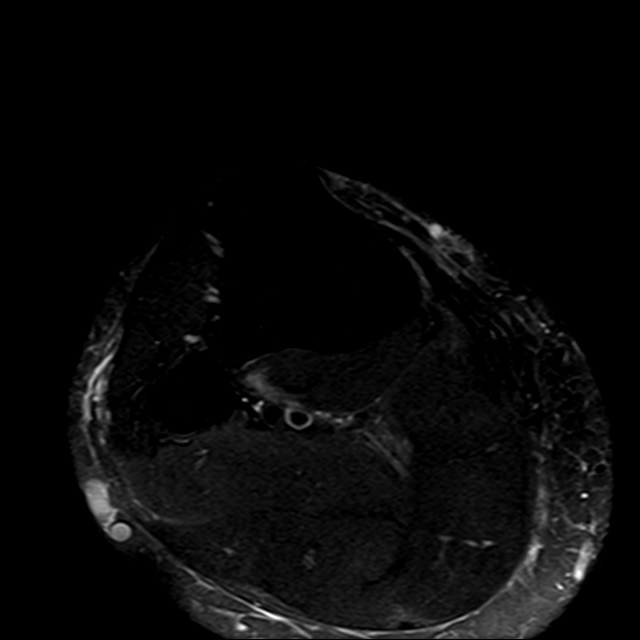
[im 15/30]
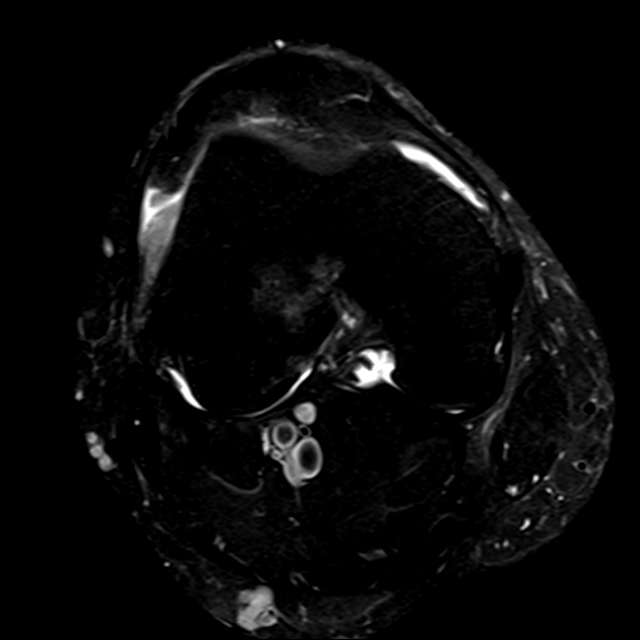
[im 30/30]
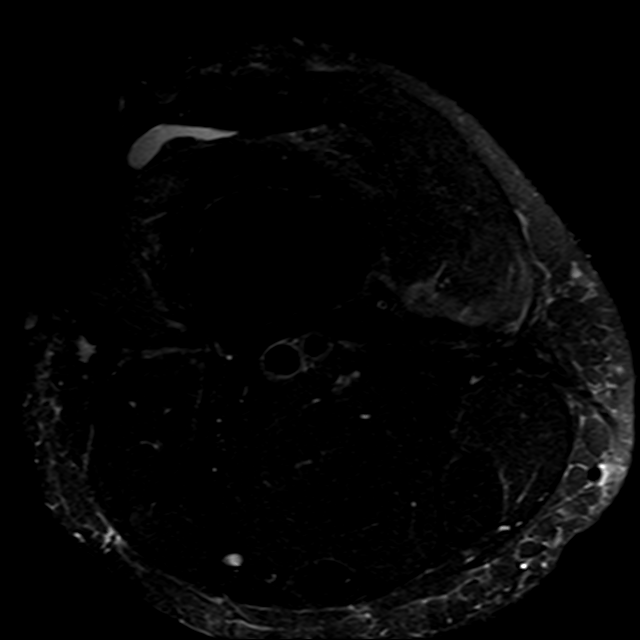

[Series 5: T1 · coronal · 4.0mm · 0.30mm/px · 6 of 34 slices shown]
[im 1/34]
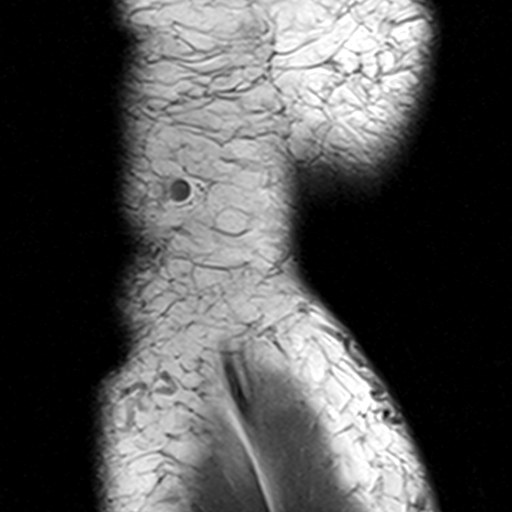
[im 7/34]
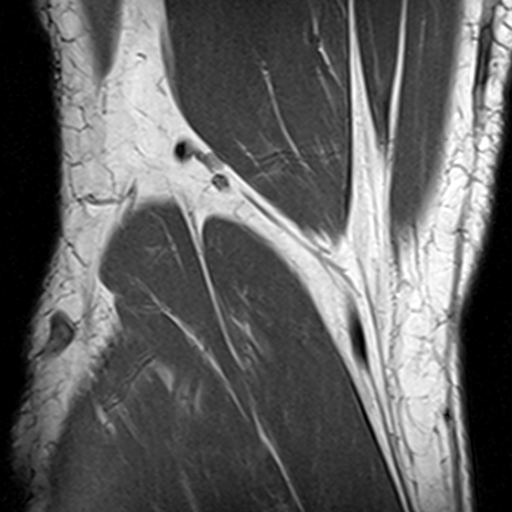
[im 14/34]
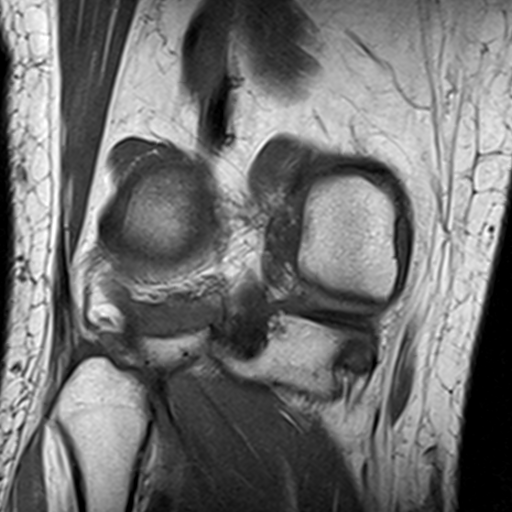
[im 20/34]
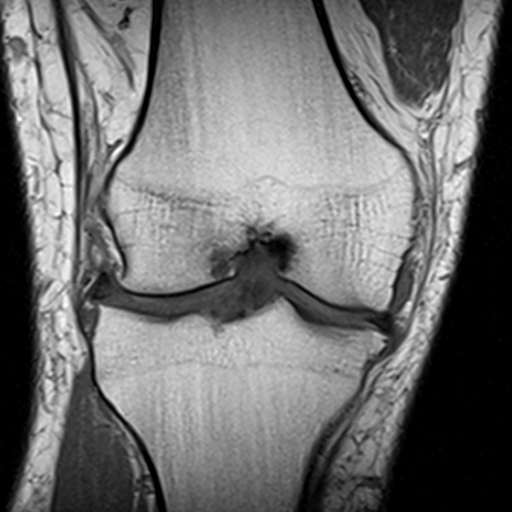
[im 27/34]
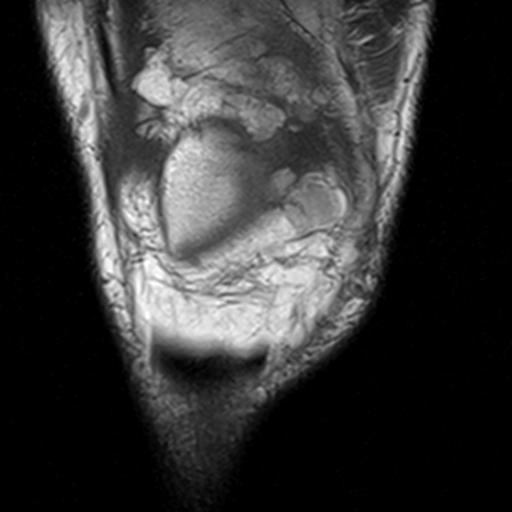
[im 34/34]
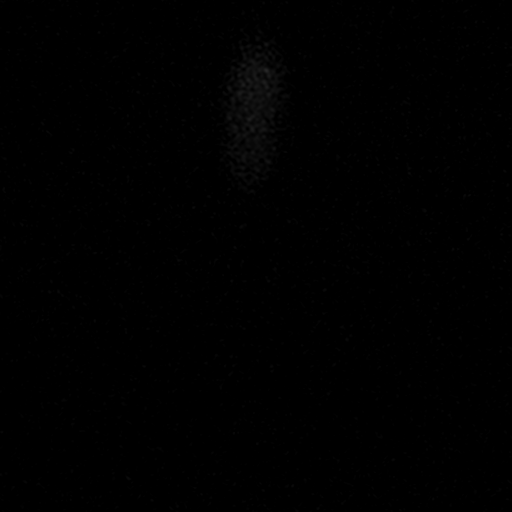

[Series 6: pdfs sag · sagittal · 3.0mm · 0.27mm/px · 3 of 32 slices shown]
[im 7/32]
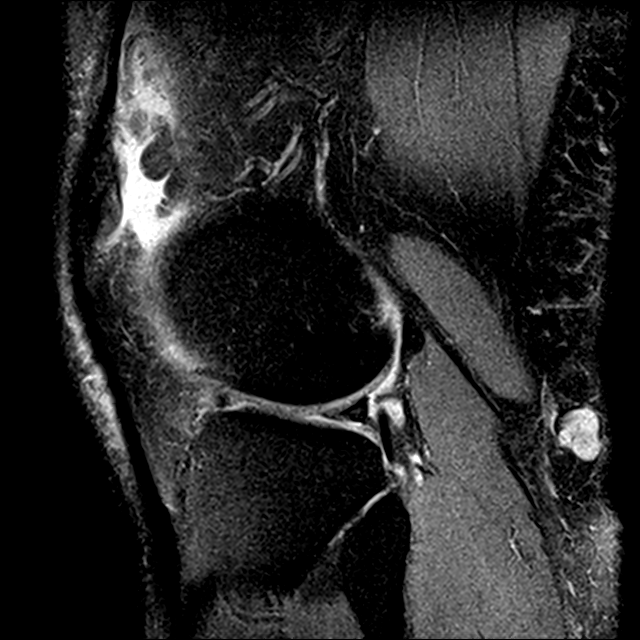
[im 19/32]
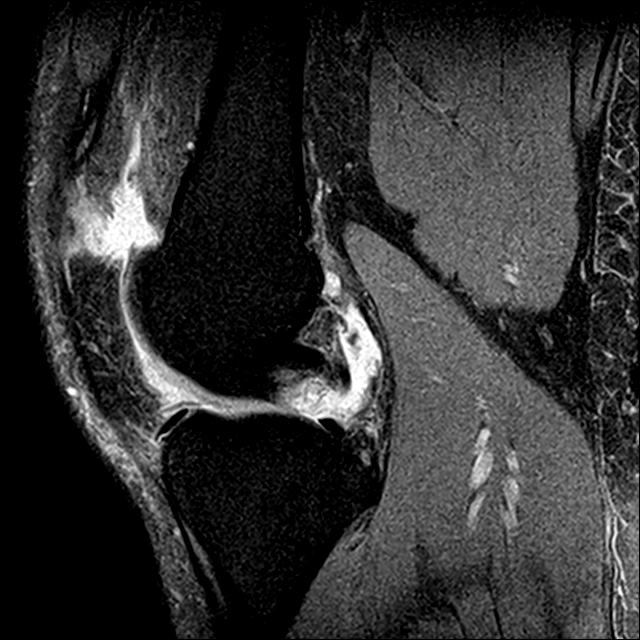
[im 32/32]
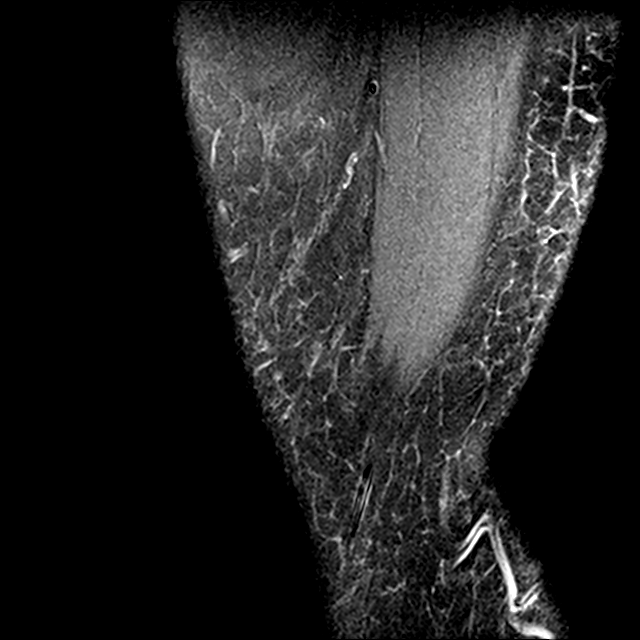

[Series 7: t2fs cor · coronal · 4.0mm · 0.31mm/px · 3 of 34 slices shown]
[im 7/34]
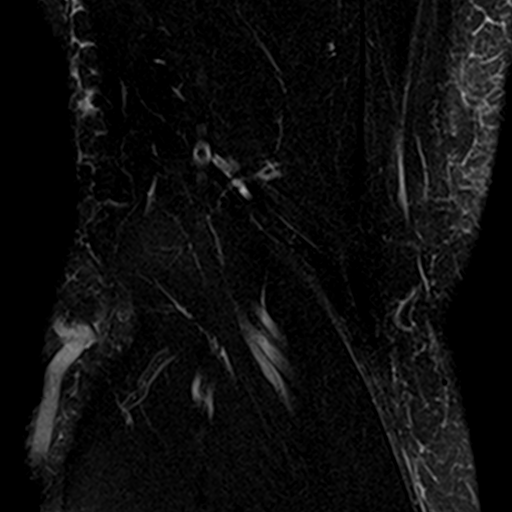
[im 20/34]
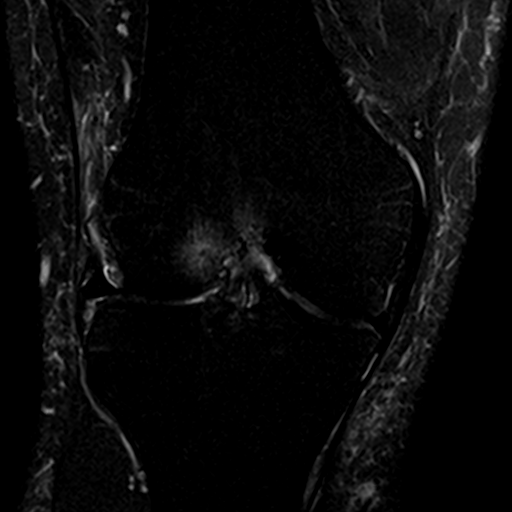
[im 34/34]
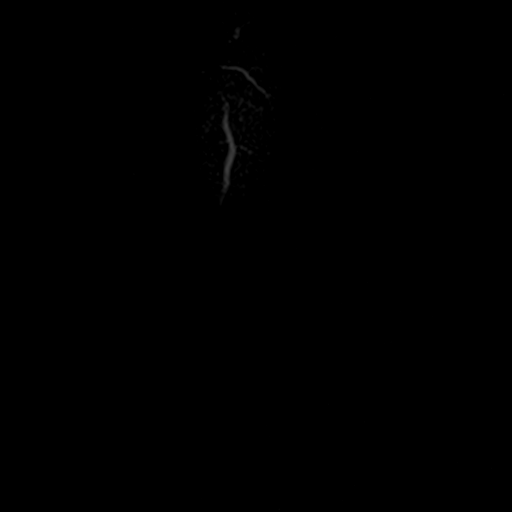

[15 of 40 positions shown; findings below may reference images not displayed]

FINDINGS: MENISCI

Medial meniscus:  Intact.

Lateral meniscus: The anterior horn is not visualized consistent
with degenerative maceration. Fraying is seen along the free edge of
the body and the body is extruded peripherally.

LIGAMENTS

Cruciates:  Intact.  Mucoid degeneration of the ACL noted.

Collaterals:  Intact.

CARTILAGE

Patellofemoral: Cartilage along the lateral patellar facet is almost
completely denuded.

Medial:  Degenerated without focal defect.

Lateral: A near full-thickness defect along the posterior
weight-bearing lateral femoral condyle measures 1.6 cm transverse by
1.3 cm AP. Fluid undermines a flap of cartilage along the lateral
aspect of this defect consistent with cartilage fragment
instability.

Joint:  Moderate joint effusion.

Popliteal Fossa:  No Baker's cyst.

Extensor Mechanism:  Intact.

Bones: Small osteophytes are seen about the knee. Increased T2
signal is seen in the femur and tibia at the attachment sites of the
ACL is likely related to mucoid degeneration. No fracture or
worrisome lesion.

Other: None.
IMPRESSION: Dominant finding is advanced for age patellofemoral and lateral
compartment osteoarthritis. As noted above, fluid undermines a flap
of hyaline cartilage along the lateral femoral condyle worrisome for
fragment instability.

Degenerative maceration anterior horn of the lateral meniscus. The
body of the lateral meniscus is extruded peripherally and
degenerated.

Mucoid degeneration of the ACL with associated reactive marrow
signal change in the tibia and femur. Negative for ligament tear.

## 2020-11-21 ENCOUNTER — Encounter (HOSPITAL_COMMUNITY): Payer: Self-pay

## 2020-11-21 ENCOUNTER — Emergency Department (HOSPITAL_COMMUNITY): Payer: BC Managed Care – PPO

## 2020-11-21 ENCOUNTER — Other Ambulatory Visit: Payer: Self-pay

## 2020-11-21 DIAGNOSIS — U071 COVID-19: Secondary | ICD-10-CM | POA: Insufficient documentation

## 2020-11-21 DIAGNOSIS — M791 Myalgia, unspecified site: Secondary | ICD-10-CM | POA: Diagnosis present

## 2020-11-21 NOTE — ED Triage Notes (Signed)
Body aches and shortness of breath with cough and congestion since today.

## 2020-11-22 ENCOUNTER — Emergency Department (HOSPITAL_COMMUNITY)
Admission: EM | Admit: 2020-11-22 | Discharge: 2020-11-22 | Disposition: A | Discharge status: Home / Self Care | Payer: BC Managed Care – PPO | Attending: Emergency Medicine | Admitting: Emergency Medicine

## 2020-11-22 DIAGNOSIS — U071 COVID-19: Secondary | ICD-10-CM

## 2020-11-22 LAB — RESP PANEL BY RT-PCR (FLU A&B, COVID) ARPGX2
Influenza A by PCR: NEGATIVE
Influenza B by PCR: NEGATIVE
SARS Coronavirus 2 by RT PCR: POSITIVE — AB

## 2020-11-22 NOTE — ED Provider Notes (Signed)
[facility] EMERGENCY DEPARTMENT Provider Note   CSN: [id number] Arrival date & time: 11/21/20  2202     History Chief Complaint  Patient presents with   Generalized Body Aches    Adam Cowan is a 40 y.o. male.  The history is provided by the patient.  Cough Severity:  Moderate Onset quality:  Gradual Duration:  1 day Timing:  Intermittent Progression:  Unchanged Chronicity:  New Smoker: no   Relieved by:  None tried Worsened by:  Nothing Associated symptoms: chills, myalgias and shortness of breath   Associated symptoms: no chest pain and no fever   Patient reports cough, congestion mild shortness of breath.  Is been ongoing for the past day.  No fevers as of yet.  No vomiting or diarrhea. No chest pain    Past Medical History:  Diagnosis Date   Arthritis     Patient Active Problem List   Diagnosis Date Noted   S/P right knee arthroscopy 12/08/18 -lateral meniscectomy chondroplasty femur lateral and patella synovectomy and removal of loose bodies 01/06/2019   Meniscus, lateral, derangement, right    Bodies, loose, joint, knee, right    CLOSED FRACTURE OF NECK OF METACARPAL BONE 07/19/2008    Past Surgical History:  Procedure Laterality Date   CHONDROPLASTY Right 12/08/2018   Procedure: CHONDROPLASTY LATERAL FEMUR AND PATELLA;  Surgeon: Vickki Hearing, MD;  Location: [facility];  Service: Orthopedics;  Laterality: Right;   HAND SURGERY Right    fracture - pins    KNEE ARTHROSCOPY WITH LATERAL MENISECTOMY Right 12/08/2018   Procedure: RIGHT KNEE ARTHROSCOPY WITH LATERAL MENISCECTOMY AND REMOVAL OF LOOSE BODIES;  Surgeon: Vickki Hearing, MD;  Location: [facility];  Service: Orthopedics;  Laterality: Right;   MEDIAL COLLATERAL LIGAMENT AND LATERAL COLLATERAL LIGAMENT REPAIR, ELBOW  2001   left knee   SYNOVECTOMY Right 12/08/2018   Procedure: SYNOVECTOMY;  Surgeon: Vickki Hearing, MD;  Location: [facility];  Service: Orthopedics;  Laterality: Right;        Family History  Problem Relation Age of Onset   CVA Mother    High blood pressure Father     Social History   Tobacco Use   Smoking status: Never   Smokeless tobacco: Never  Vaping Use   Vaping Use: Never used  Substance Use Topics   Alcohol use: No   Drug use: No    Home Medications Prior to Admission medications   Medication Sig Start Date End Date Taking? Authorizing Provider  Aspirin-Caffeine (BAYER BACK & BODY) 500-32.5 MG TABS Take 2 tablets by mouth 2 (two) times daily as needed (pain).    [provider]  HYDROcodone-acetaminophen (NORCO/VICODIN) 5-325 MG tablet Take 1 tablet by mouth every 8 (eight) hours as needed for moderate pain. 02/03/19   Vickki Hearing, MD  ibuprofen (ADVIL) 800 MG tablet Take 1 tablet (800 mg total) by mouth every 8 (eight) hours as needed. 01/06/19   Vickki Hearing, MD    Allergies    Patient has no known allergies.  Review of Systems   Review of Systems  Constitutional:  Positive for chills. Negative for fever.  Respiratory:  Positive for cough and shortness of breath.   Cardiovascular:  Negative for chest pain.  Musculoskeletal:  Positive for myalgias.  All other systems reviewed and are negative.  Physical Exam Updated Vital Signs BP (!) 188/75   Pulse 73   Temp 99.6 F (37.6 C) (Oral)   Resp 10   SpO2  100%   Physical Exam CONSTITUTIONAL: Well developed/well nourished, no distress HEAD: Normocephalic/atraumatic EYES: EOMI/PERRL ENMT: Mucous membranes moist NECK: supple no meningeal signs SPINE/BACK:entire spine nontender CV: S1/S2 noted, no murmurs/rubs/gallops noted LUNGS: Lungs are clear to auscultation bilaterally, no apparent distress ABDOMEN: soft, nontender NEURO: Pt is awake/alert/appropriate, moves all extremitiesx4.  No facial droop.   EXTREMITIES: pulses normal/equal, full ROM SKIN: warm, color normal PSYCH: no abnormalities of mood noted, alert and oriented to situation  ED  Results / Procedures / Treatments   Labs (all labs ordered are listed, but only abnormal results are displayed) Labs Reviewed  RESP PANEL BY RT-PCR (FLU A&B, COVID) ARPGX2 - Abnormal; Notable for the following components:      Result Value   SARS Coronavirus 2 by RT PCR POSITIVE (*)    All other components within normal limits    EKG EKG Interpretation  Date/Time:  Thursday November 21 2020 22:20:09 EDT Ventricular Rate:  92 PR Interval:  176 QRS Duration: 98 QT Interval:  324 QTC Calculation: 400 R Axis:   71 Text Interpretation: Normal sinus rhythm T wave abnormality No significant change since last tracing Confirmed by Zadie Rhine ([id number]) on 11/22/2020 1:54:45 AM  Radiology DG Chest 1 View  Result Date: 11/21/2020 CLINICAL DATA:  Shortness of breath. EXAM: CHEST  1 VIEW COMPARISON:  Chest radiograph dated 05/31/2016. FINDINGS: The heart size and mediastinal contours are within normal limits. Both lungs are clear. The visualized skeletal structures are unremarkable. IMPRESSION: No active disease. Electronically Signed   By: Elgie Collard M.D.   On: 11/21/2020 23:21    Procedures Procedures   Medications Ordered in ED Medications - No data to display  ED Course  I have reviewed the triage vital signs and the nursing notes.  Pertinent labs & imaging results that were available during my care of the patient were reviewed by me and considered in my medical decision making (see chart for details).    MDM Rules/Calculators/A&P                           Patient found to have COVID-19.  Multiple pulse ox readings have been above 95%.  He is in no distress.  Chest x-ray was personally reviewed and negative He declines any further medications, he specifically declines use of oral antiviral paxlovid We discussed strict  return precautions.  Patient will isolate at home Final Clinical Impression(s) / ED Diagnoses Final diagnoses:  COVID-19    Rx / DC Orders ED Discharge  Orders     None        Zadie Rhine, MD 11/22/20 [phone]

## 2023-02-04 IMAGING — DX DG CHEST 1V
1 series · 1 of 1 positions shown · non-contrast
Comparison: Chest radiograph dated 05/31/2016.

CLINICAL DATA: Shortness of breath.

EXAM:
CHEST  1 VIEW

[chest ap]
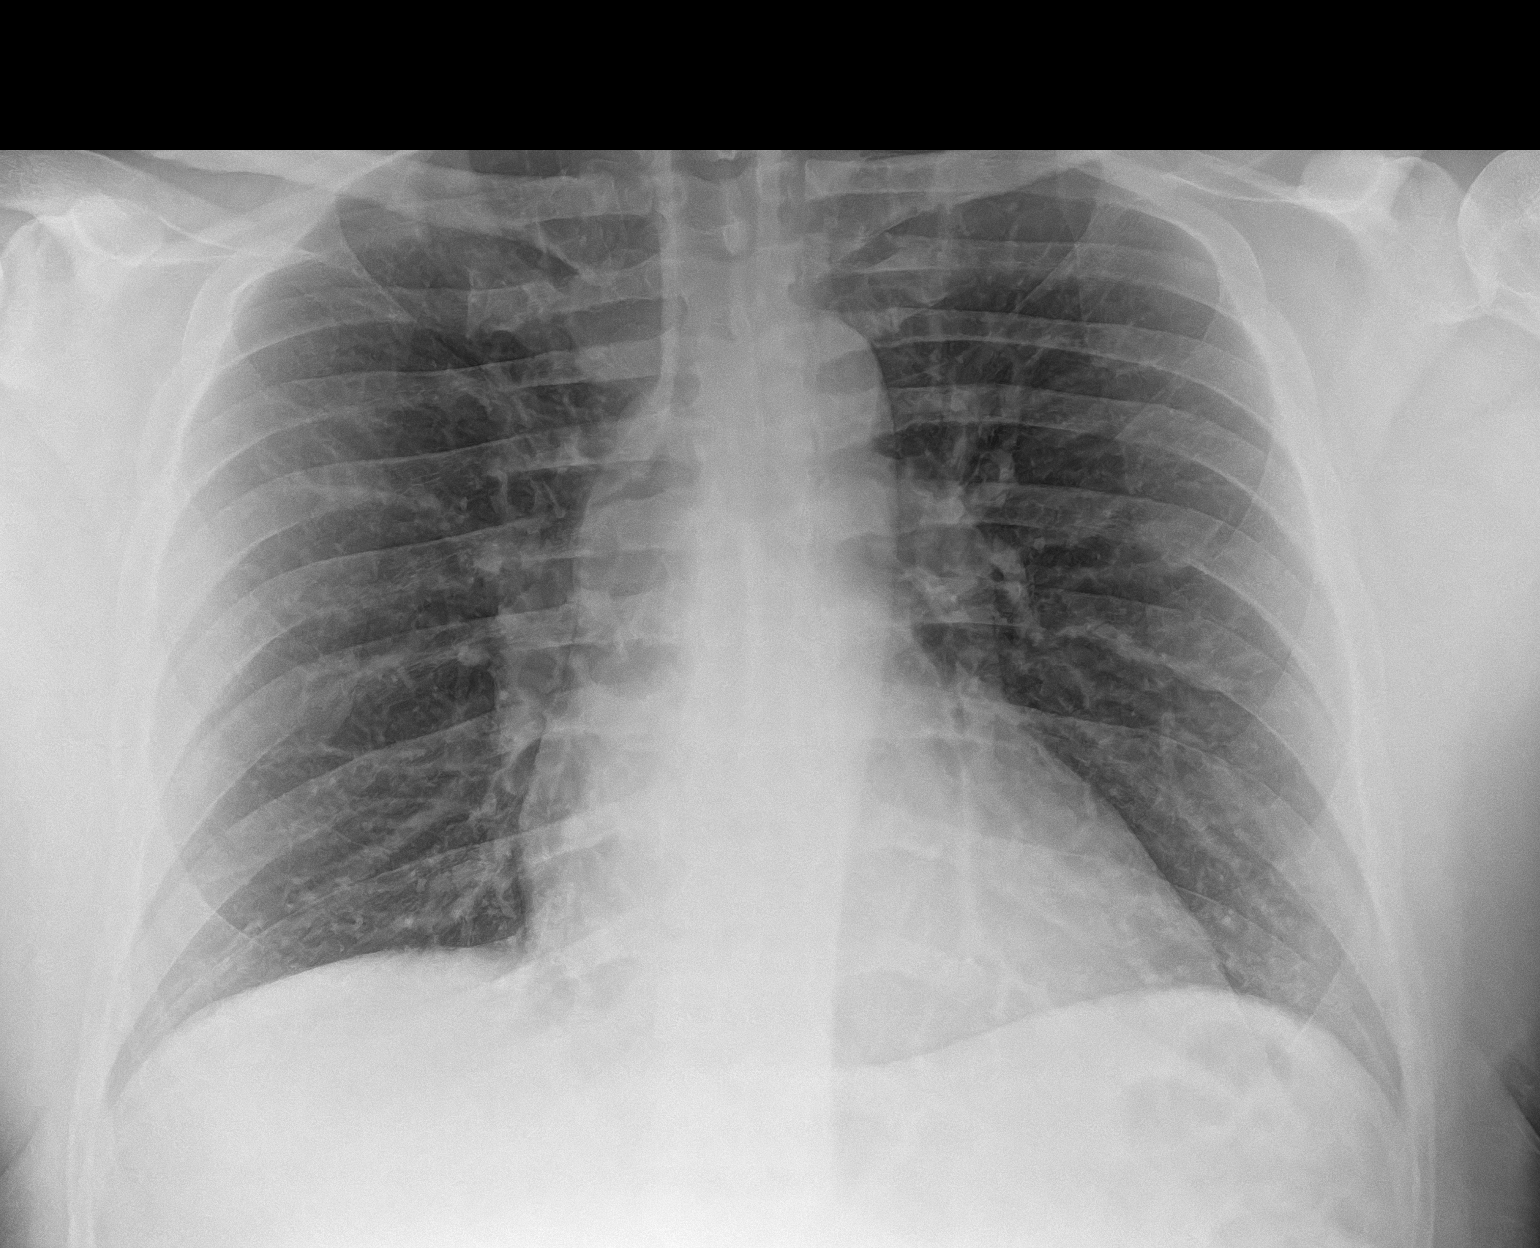

[1 of 1 positions shown; findings below may reference images not displayed]

FINDINGS: The heart size and mediastinal contours are within normal limits.
Both lungs are clear. The visualized skeletal structures are
unremarkable.
IMPRESSION: No active disease.
